# Patient Record
Sex: Female | Born: 1935 | Race: Black or African American | Hispanic: No | Smoking: Never smoker
Health system: Southern US, Community
[De-identification: ages and names within clinical notes are randomized; demographics above are authoritative.]

## PROBLEM LIST (undated history)

## (undated) DIAGNOSIS — N183 Chronic kidney disease, stage 3 unspecified: Secondary | ICD-10-CM

## (undated) DIAGNOSIS — M1990 Unspecified osteoarthritis, unspecified site: Secondary | ICD-10-CM

## (undated) DIAGNOSIS — I10 Essential (primary) hypertension: Secondary | ICD-10-CM

## (undated) DIAGNOSIS — E785 Hyperlipidemia, unspecified: Secondary | ICD-10-CM

## (undated) DIAGNOSIS — D649 Anemia, unspecified: Secondary | ICD-10-CM

## (undated) HISTORY — DX: Unspecified osteoarthritis, unspecified site: M19.90

## (undated) HISTORY — DX: Essential (primary) hypertension: I10

## (undated) HISTORY — DX: Chronic kidney disease, stage 3 (moderate): N18.3

## (undated) HISTORY — DX: Hyperlipidemia, unspecified: E78.5

## (undated) HISTORY — DX: Chronic kidney disease, stage 3 unspecified: N18.30

## (undated) HISTORY — DX: Anemia, unspecified: D64.9

---

## 2001-12-09 ENCOUNTER — Emergency Department (HOSPITAL_COMMUNITY): Admission: EM | Admit: 2001-12-09 | Discharge: 2001-12-09 | Payer: Self-pay | Admitting: *Deleted

## 2002-03-28 ENCOUNTER — Ambulatory Visit (HOSPITAL_COMMUNITY): Admission: RE | Admit: 2002-03-28 | Discharge: 2002-03-28 | Payer: Self-pay | Admitting: Family Medicine

## 2002-03-28 ENCOUNTER — Encounter: Payer: Self-pay | Admitting: Family Medicine

## 2003-11-09 ENCOUNTER — Ambulatory Visit (HOSPITAL_COMMUNITY): Admission: RE | Admit: 2003-11-09 | Discharge: 2003-11-09 | Payer: Self-pay | Admitting: Family Medicine

## 2005-03-24 ENCOUNTER — Ambulatory Visit (HOSPITAL_COMMUNITY): Admission: RE | Admit: 2005-03-24 | Discharge: 2005-03-24 | Payer: Self-pay | Admitting: Family Medicine

## 2005-04-23 ENCOUNTER — Ambulatory Visit (HOSPITAL_COMMUNITY): Admission: RE | Admit: 2005-04-23 | Discharge: 2005-04-23 | Payer: Self-pay | Admitting: Family Medicine

## 2005-07-11 ENCOUNTER — Ambulatory Visit (HOSPITAL_COMMUNITY): Admission: RE | Admit: 2005-07-11 | Discharge: 2005-07-11 | Payer: Self-pay | Admitting: Family Medicine

## 2005-09-25 ENCOUNTER — Encounter (INDEPENDENT_AMBULATORY_CARE_PROVIDER_SITE_OTHER): Payer: Self-pay | Admitting: Family Medicine

## 2005-09-25 LAB — CONVERTED CEMR LAB: Hgb A1c MFr Bld: 6.9 %

## 2006-04-28 ENCOUNTER — Ambulatory Visit: Payer: Self-pay | Admitting: Family Medicine

## 2006-05-07 ENCOUNTER — Encounter (INDEPENDENT_AMBULATORY_CARE_PROVIDER_SITE_OTHER): Payer: Self-pay | Admitting: Family Medicine

## 2006-05-22 ENCOUNTER — Ambulatory Visit: Payer: Self-pay | Admitting: Family Medicine

## 2006-05-30 ENCOUNTER — Encounter (INDEPENDENT_AMBULATORY_CARE_PROVIDER_SITE_OTHER): Payer: Self-pay | Admitting: Family Medicine

## 2006-06-11 ENCOUNTER — Encounter (INDEPENDENT_AMBULATORY_CARE_PROVIDER_SITE_OTHER): Payer: Self-pay | Admitting: Family Medicine

## 2006-06-15 ENCOUNTER — Encounter (INDEPENDENT_AMBULATORY_CARE_PROVIDER_SITE_OTHER): Payer: Self-pay | Admitting: Family Medicine

## 2006-06-19 ENCOUNTER — Ambulatory Visit: Payer: Self-pay | Admitting: Family Medicine

## 2006-06-20 ENCOUNTER — Encounter (INDEPENDENT_AMBULATORY_CARE_PROVIDER_SITE_OTHER): Payer: Self-pay | Admitting: Family Medicine

## 2006-06-22 ENCOUNTER — Encounter (INDEPENDENT_AMBULATORY_CARE_PROVIDER_SITE_OTHER): Payer: Self-pay | Admitting: Family Medicine

## 2006-06-26 ENCOUNTER — Ambulatory Visit: Payer: Self-pay | Admitting: Family Medicine

## 2006-06-28 ENCOUNTER — Encounter (INDEPENDENT_AMBULATORY_CARE_PROVIDER_SITE_OTHER): Payer: Self-pay | Admitting: Family Medicine

## 2006-07-11 ENCOUNTER — Encounter (INDEPENDENT_AMBULATORY_CARE_PROVIDER_SITE_OTHER): Payer: Self-pay | Admitting: Family Medicine

## 2006-07-24 ENCOUNTER — Ambulatory Visit: Payer: Self-pay | Admitting: Family Medicine

## 2006-08-20 ENCOUNTER — Encounter: Payer: Self-pay | Admitting: Family Medicine

## 2006-08-20 DIAGNOSIS — I10 Essential (primary) hypertension: Secondary | ICD-10-CM | POA: Insufficient documentation

## 2006-08-20 DIAGNOSIS — N183 Chronic kidney disease, stage 3 (moderate): Secondary | ICD-10-CM

## 2006-08-20 DIAGNOSIS — E785 Hyperlipidemia, unspecified: Secondary | ICD-10-CM

## 2006-08-20 DIAGNOSIS — M1990 Unspecified osteoarthritis, unspecified site: Secondary | ICD-10-CM | POA: Insufficient documentation

## 2006-08-20 DIAGNOSIS — L821 Other seborrheic keratosis: Secondary | ICD-10-CM

## 2006-09-17 ENCOUNTER — Encounter (INDEPENDENT_AMBULATORY_CARE_PROVIDER_SITE_OTHER): Payer: Self-pay | Admitting: Family Medicine

## 2006-09-18 ENCOUNTER — Encounter (INDEPENDENT_AMBULATORY_CARE_PROVIDER_SITE_OTHER): Payer: Self-pay | Admitting: Family Medicine

## 2006-10-28 ENCOUNTER — Encounter (INDEPENDENT_AMBULATORY_CARE_PROVIDER_SITE_OTHER): Payer: Self-pay | Admitting: Family Medicine

## 2006-10-29 ENCOUNTER — Encounter (INDEPENDENT_AMBULATORY_CARE_PROVIDER_SITE_OTHER): Payer: Self-pay | Admitting: Family Medicine

## 2006-10-30 ENCOUNTER — Ambulatory Visit: Payer: Self-pay | Admitting: Family Medicine

## 2006-10-30 DIAGNOSIS — E119 Type 2 diabetes mellitus without complications: Secondary | ICD-10-CM

## 2006-10-30 LAB — CONVERTED CEMR LAB
Cholesterol, target level: 200 mg/dL
Glucose, Bld: 204 mg/dL
HDL goal, serum: 40 mg/dL
Hgb A1c MFr Bld: 6.2 %
LDL Goal: 100 mg/dL

## 2006-12-14 ENCOUNTER — Encounter (INDEPENDENT_AMBULATORY_CARE_PROVIDER_SITE_OTHER): Payer: Self-pay | Admitting: Family Medicine

## 2007-01-08 ENCOUNTER — Encounter (INDEPENDENT_AMBULATORY_CARE_PROVIDER_SITE_OTHER): Payer: Self-pay | Admitting: Family Medicine

## 2007-01-11 LAB — CONVERTED CEMR LAB
ALT: <8 units/L (ref 0–35)
AST: 11 units/L (ref 0–37)
Albumin: 3.4 g/dL — ABNORMAL LOW (ref 3.5–5.2)
Alkaline Phosphatase: 40 units/L (ref 39–117)
BUN: 31 mg/dL — ABNORMAL HIGH (ref 6–23)
Basophils Absolute: 0 10*3/uL (ref 0.0–0.1)
Basophils Relative: 1 % (ref 0–1)
Calcium: 9 mg/dL (ref 8.4–10.5)
Creatinine, Ser: 1.59 mg/dL — ABNORMAL HIGH (ref 0.40–1.20)
Eosinophils Absolute: 0.4 10*3/uL (ref 0.0–0.7)
HCT: 31.2 % — ABNORMAL LOW (ref 36.0–46.0)
HDL: 35 mg/dL — ABNORMAL LOW (ref >39)
Hemoglobin: 9.5 g/dL — ABNORMAL LOW (ref 12.0–15.0)
LDL Cholesterol: 96 mg/dL (ref 0–99)
Lymphocytes Relative: 37 % (ref 12–46)
MCHC: 30.4 g/dL (ref 30.0–36.0)
MCV: 87.9 fL (ref 78.0–100.0)
Monocytes Absolute: 0.5 10*3/uL (ref 0.2–0.7)
Monocytes Relative: 9 % (ref 3–11)
Neutro Abs: 2.4 10*3/uL (ref 1.7–7.7)
Neutrophils Relative %: 46 % (ref 43–77)
Platelets: 270 10*3/uL (ref 150–400)
Potassium: 4.9 meq/L (ref 3.5–5.3)
RDW: 15.4 % — ABNORMAL HIGH (ref 11.5–14.0)
Sodium: 139 meq/L (ref 135–145)
TSH: 2.832 microintl units/mL (ref 0.350–5.50)
Total CHOL/HDL Ratio: 4.5
Total Protein: 6.6 g/dL (ref 6.0–8.3)
Triglycerides: 135 mg/dL (ref <150)
VLDL: 27 mg/dL (ref 0–40)
WBC: 5.2 10*3/uL (ref 4.0–10.5)

## 2007-01-14 ENCOUNTER — Encounter (INDEPENDENT_AMBULATORY_CARE_PROVIDER_SITE_OTHER): Payer: Self-pay | Admitting: Family Medicine

## 2007-01-15 ENCOUNTER — Encounter (INDEPENDENT_AMBULATORY_CARE_PROVIDER_SITE_OTHER): Payer: Self-pay | Admitting: Family Medicine

## 2007-01-22 ENCOUNTER — Ambulatory Visit: Payer: Self-pay | Admitting: Family Medicine

## 2007-01-22 DIAGNOSIS — D649 Anemia, unspecified: Secondary | ICD-10-CM

## 2007-01-22 LAB — CONVERTED CEMR LAB
Glucose, Bld: 200 mg/dL
Hemoglobin: 10.1 g/dL
Hgb A1c MFr Bld: 6.1 %

## 2007-01-25 LAB — CONVERTED CEMR LAB
Ferritin: 73 ng/mL (ref 10–291)
Folate: >20 ng/mL
Iron: 71 ug/dL (ref 42–145)
Retic Count, Absolute: 45.5 (ref 19.0–186.0)
Retic Ct Pct: 1.3 % (ref 0.4–3.1)
Saturation Ratios: 25 % (ref 20–55)
TIBC: 285 ug/dL (ref 250–470)
UIBC: 214 ug/dL
Vitamin B-12: 242 pg/mL (ref 211–911)

## 2007-02-03 ENCOUNTER — Ambulatory Visit: Payer: Self-pay | Admitting: Family Medicine

## 2007-02-16 ENCOUNTER — Encounter (INDEPENDENT_AMBULATORY_CARE_PROVIDER_SITE_OTHER): Payer: Self-pay | Admitting: Family Medicine

## 2007-03-01 ENCOUNTER — Encounter (INDEPENDENT_AMBULATORY_CARE_PROVIDER_SITE_OTHER): Payer: Self-pay | Admitting: Family Medicine

## 2007-03-15 ENCOUNTER — Encounter (INDEPENDENT_AMBULATORY_CARE_PROVIDER_SITE_OTHER): Payer: Self-pay | Admitting: Family Medicine

## 2007-03-16 ENCOUNTER — Encounter (INDEPENDENT_AMBULATORY_CARE_PROVIDER_SITE_OTHER): Payer: Self-pay | Admitting: Family Medicine

## 2007-03-19 ENCOUNTER — Ambulatory Visit: Payer: Self-pay | Admitting: Family Medicine

## 2007-03-19 LAB — CONVERTED CEMR LAB: Hemoglobin: 10 g/dL

## 2007-05-20 ENCOUNTER — Encounter (INDEPENDENT_AMBULATORY_CARE_PROVIDER_SITE_OTHER): Payer: Self-pay | Admitting: Family Medicine

## 2007-05-31 ENCOUNTER — Ambulatory Visit: Payer: Self-pay | Admitting: Family Medicine

## 2007-05-31 LAB — CONVERTED CEMR LAB
Glucose, Bld: 209 mg/dL
Hgb A1c MFr Bld: 6.2 %

## 2007-06-01 ENCOUNTER — Telehealth (INDEPENDENT_AMBULATORY_CARE_PROVIDER_SITE_OTHER): Payer: Self-pay | Admitting: *Deleted

## 2007-06-01 ENCOUNTER — Encounter (INDEPENDENT_AMBULATORY_CARE_PROVIDER_SITE_OTHER): Payer: Self-pay | Admitting: Family Medicine

## 2007-06-01 LAB — CONVERTED CEMR LAB
ALT: <8 units/L (ref 0–35)
AST: 14 units/L (ref 0–37)
Albumin: 3.6 g/dL (ref 3.5–5.2)
Alkaline Phosphatase: 41 units/L (ref 39–117)
BUN: 36 mg/dL — ABNORMAL HIGH (ref 6–23)
Basophils Absolute: 0 10*3/uL (ref 0.0–0.1)
Basophils Relative: 0 % (ref 0–1)
CO2: 19 meq/L (ref 19–32)
Calcium: 9 mg/dL (ref 8.4–10.5)
Chloride: 106 meq/L (ref 96–112)
Creatinine, Ser: 1.72 mg/dL — ABNORMAL HIGH (ref 0.40–1.20)
Eosinophils Absolute: 0.3 10*3/uL (ref 0.0–0.7)
Eosinophils Relative: 6 % — ABNORMAL HIGH (ref 0–5)
Glucose, Bld: 152 mg/dL — ABNORMAL HIGH (ref 70–99)
HCT: 30.7 % — ABNORMAL LOW (ref 36.0–46.0)
Hemoglobin: 9.5 g/dL — ABNORMAL LOW (ref 12.0–15.0)
Lymphocytes Relative: 34 % (ref 12–46)
Lymphs Abs: 1.6 10*3/uL (ref 0.7–3.3)
MCHC: 30.9 g/dL (ref 30.0–36.0)
MCV: 86 fL (ref 78.0–100.0)
Monocytes Absolute: 0.4 10*3/uL (ref 0.2–0.7)
Monocytes Relative: 9 % (ref 3–11)
Neutro Abs: 2.3 10*3/uL (ref 1.7–7.7)
Neutrophils Relative %: 51 % (ref 43–77)
Platelets: 240 10*3/uL (ref 150–400)
Potassium: 4.4 meq/L (ref 3.5–5.3)
RBC: 3.57 M/uL — ABNORMAL LOW (ref 3.87–5.11)
RDW: 14.7 % — ABNORMAL HIGH (ref 11.5–14.0)
Sodium: 139 meq/L (ref 135–145)
Total Bilirubin: 0.4 mg/dL (ref 0.3–1.2)
Total Protein: 7.1 g/dL (ref 6.0–8.3)
WBC: 4.6 10*3/uL (ref 4.0–10.5)

## 2007-06-28 ENCOUNTER — Encounter (INDEPENDENT_AMBULATORY_CARE_PROVIDER_SITE_OTHER): Payer: Self-pay | Admitting: Family Medicine

## 2007-06-29 ENCOUNTER — Encounter (INDEPENDENT_AMBULATORY_CARE_PROVIDER_SITE_OTHER): Payer: Self-pay | Admitting: Family Medicine

## 2007-07-13 ENCOUNTER — Encounter (INDEPENDENT_AMBULATORY_CARE_PROVIDER_SITE_OTHER): Payer: Self-pay | Admitting: Family Medicine

## 2007-08-31 ENCOUNTER — Ambulatory Visit: Payer: Self-pay | Admitting: Family Medicine

## 2007-08-31 LAB — CONVERTED CEMR LAB
Glucose, Bld: 117 mg/dL
Hgb A1c MFr Bld: 5.9 %

## 2007-09-01 ENCOUNTER — Encounter (INDEPENDENT_AMBULATORY_CARE_PROVIDER_SITE_OTHER): Payer: Self-pay | Admitting: Family Medicine

## 2007-09-02 ENCOUNTER — Telehealth (INDEPENDENT_AMBULATORY_CARE_PROVIDER_SITE_OTHER): Payer: Self-pay | Admitting: *Deleted

## 2007-09-02 LAB — CONVERTED CEMR LAB
ALT: <8 units/L (ref 0–35)
AST: 12 units/L (ref 0–37)
Albumin: 3.7 g/dL (ref 3.5–5.2)
Alkaline Phosphatase: 44 units/L (ref 39–117)
BUN: 31 mg/dL — ABNORMAL HIGH (ref 6–23)
Basophils Absolute: 0 10*3/uL (ref 0.0–0.1)
Basophils Relative: 1 % (ref 0–1)
CO2: 21 meq/L (ref 19–32)
Calcium: 8.9 mg/dL (ref 8.4–10.5)
Chloride: 109 meq/L (ref 96–112)
Creatinine, Ser: 1.59 mg/dL — ABNORMAL HIGH (ref 0.40–1.20)
Eosinophils Absolute: 0.4 10*3/uL (ref 0.2–0.7)
Eosinophils Relative: 8 % — ABNORMAL HIGH (ref 0–5)
Glucose, Bld: 83 mg/dL (ref 70–99)
HCT: 32.4 % — ABNORMAL LOW (ref 36.0–46.0)
Hemoglobin: 10 g/dL — ABNORMAL LOW (ref 12.0–15.0)
Lymphocytes Relative: 37 % (ref 12–46)
Lymphs Abs: 1.8 10*3/uL (ref 0.7–4.0)
MCHC: 30.9 g/dL (ref 30.0–36.0)
MCV: 88.3 fL (ref 78.0–100.0)
Monocytes Absolute: 0.4 10*3/uL (ref 0.1–1.0)
Monocytes Relative: 9 % (ref 3–12)
Neutro Abs: 2.3 10*3/uL (ref 1.7–7.7)
Neutrophils Relative %: 46 % (ref 43–77)
Platelets: 223 10*3/uL (ref 150–400)
Potassium: 4.4 meq/L (ref 3.5–5.3)
RBC: 3.67 M/uL — ABNORMAL LOW (ref 3.87–5.11)
RDW: 15.5 % (ref 11.5–15.5)
Sodium: 141 meq/L (ref 135–145)
Total Bilirubin: 0.3 mg/dL (ref 0.3–1.2)
Total Protein: 7 g/dL (ref 6.0–8.3)
WBC: 4.9 10*3/uL (ref 4.0–10.5)

## 2007-09-10 ENCOUNTER — Encounter (INDEPENDENT_AMBULATORY_CARE_PROVIDER_SITE_OTHER): Payer: Self-pay | Admitting: Family Medicine

## 2007-10-28 ENCOUNTER — Encounter (INDEPENDENT_AMBULATORY_CARE_PROVIDER_SITE_OTHER): Payer: Self-pay | Admitting: Family Medicine

## 2007-11-12 ENCOUNTER — Encounter (INDEPENDENT_AMBULATORY_CARE_PROVIDER_SITE_OTHER): Payer: Self-pay | Admitting: Family Medicine

## 2007-11-30 ENCOUNTER — Ambulatory Visit: Payer: Self-pay | Admitting: Family Medicine

## 2007-11-30 ENCOUNTER — Telehealth (INDEPENDENT_AMBULATORY_CARE_PROVIDER_SITE_OTHER): Payer: Self-pay | Admitting: *Deleted

## 2007-11-30 LAB — CONVERTED CEMR LAB
Glucose, Bld: 235 mg/dL
Hemoglobin: 10.3 g/dL
Hgb A1c MFr Bld: 6 %

## 2007-12-16 ENCOUNTER — Encounter (INDEPENDENT_AMBULATORY_CARE_PROVIDER_SITE_OTHER): Payer: Self-pay | Admitting: Family Medicine

## 2008-01-10 ENCOUNTER — Encounter (INDEPENDENT_AMBULATORY_CARE_PROVIDER_SITE_OTHER): Payer: Self-pay | Admitting: Family Medicine

## 2008-01-11 ENCOUNTER — Ambulatory Visit: Payer: Self-pay | Admitting: Family Medicine

## 2008-01-11 LAB — CONVERTED CEMR LAB
Bilirubin Urine: NEGATIVE
Blood in Urine, dipstick: NEGATIVE
Glucose, Urine, Semiquant: NEGATIVE
Ketones, urine, test strip: NEGATIVE
Nitrite: NEGATIVE
Protein, U semiquant: NEGATIVE
Specific Gravity, Urine: 1.01
Urobilinogen, UA: 0.2
pH: 7

## 2008-01-13 ENCOUNTER — Telehealth (INDEPENDENT_AMBULATORY_CARE_PROVIDER_SITE_OTHER): Payer: Self-pay | Admitting: *Deleted

## 2008-01-13 LAB — CONVERTED CEMR LAB
ALT: <8 units/L (ref 0–35)
AST: 13 units/L (ref 0–37)
Albumin: 3.8 g/dL (ref 3.5–5.2)
Alkaline Phosphatase: 44 units/L (ref 39–117)
BUN: 32 mg/dL — ABNORMAL HIGH (ref 6–23)
CO2: 24 meq/L (ref 19–32)
Calcium: 9.1 mg/dL (ref 8.4–10.5)
Chloride: 105 meq/L (ref 96–112)
Cholesterol: 173 mg/dL (ref 0–200)
Creatinine, Ser: 1.64 mg/dL — ABNORMAL HIGH (ref 0.40–1.20)
Creatinine, Urine: 153.3 mg/dL
Glucose, Bld: 99 mg/dL (ref 70–99)
HDL: 32 mg/dL — ABNORMAL LOW (ref >39)
LDL Cholesterol: 97 mg/dL (ref 0–99)
Microalb Creat Ratio: 9 mg/g (ref 0.0–30.0)
Microalb, Ur: 1.38 mg/dL (ref 0.00–1.89)
Potassium: 4.6 meq/L (ref 3.5–5.3)
Sodium: 140 meq/L (ref 135–145)
TSH: 4.841 microintl units/mL (ref 0.350–5.50)
Total Bilirubin: 0.3 mg/dL (ref 0.3–1.2)
Total CHOL/HDL Ratio: 5.4
Total Protein: 7.2 g/dL (ref 6.0–8.3)
Triglycerides: 219 mg/dL — ABNORMAL HIGH (ref <150)
VLDL: 44 mg/dL — ABNORMAL HIGH (ref 0–40)

## 2008-02-10 ENCOUNTER — Ambulatory Visit: Payer: Self-pay | Admitting: Family Medicine

## 2008-05-11 ENCOUNTER — Ambulatory Visit: Payer: Self-pay | Admitting: Family Medicine

## 2008-05-11 LAB — CONVERTED CEMR LAB
Glucose, Bld: 114 mg/dL
Hgb A1c MFr Bld: 6 %

## 2008-05-12 ENCOUNTER — Encounter (INDEPENDENT_AMBULATORY_CARE_PROVIDER_SITE_OTHER): Payer: Self-pay | Admitting: Family Medicine

## 2008-05-12 LAB — CONVERTED CEMR LAB
BUN: 31 mg/dL — ABNORMAL HIGH (ref 6–23)
Basophils Absolute: 0 10*3/uL (ref 0.0–0.1)
Basophils Relative: 1 % (ref 0–1)
CO2: 22 meq/L (ref 19–32)
Calcium: 8.7 mg/dL (ref 8.4–10.5)
Chloride: 108 meq/L (ref 96–112)
Creatinine, Ser: 1.64 mg/dL — ABNORMAL HIGH (ref 0.40–1.20)
Eosinophils Absolute: 0.3 10*3/uL (ref 0.0–0.7)
Eosinophils Relative: 8 % — ABNORMAL HIGH (ref 0–5)
Glucose, Bld: 106 mg/dL — ABNORMAL HIGH (ref 70–99)
HCT: 29.4 % — ABNORMAL LOW (ref 36.0–46.0)
Hemoglobin: 9.4 g/dL — ABNORMAL LOW (ref 12.0–15.0)
Lymphocytes Relative: 45 % (ref 12–46)
Lymphs Abs: 1.9 10*3/uL (ref 0.7–4.0)
MCHC: 32 g/dL (ref 30.0–36.0)
MCV: 86 fL (ref 78.0–100.0)
Monocytes Absolute: 0.4 10*3/uL (ref 0.1–1.0)
Monocytes Relative: 10 % (ref 3–12)
Neutro Abs: 1.5 10*3/uL — ABNORMAL LOW (ref 1.7–7.7)
Neutrophils Relative %: 37 % — ABNORMAL LOW (ref 43–77)
Platelets: 216 10*3/uL (ref 150–400)
Potassium: 4.4 meq/L (ref 3.5–5.3)
RBC: 3.42 M/uL — ABNORMAL LOW (ref 3.87–5.11)
RDW: 15 % (ref 11.5–15.5)
Sodium: 140 meq/L (ref 135–145)
WBC: 4.2 10*3/uL (ref 4.0–10.5)

## 2008-08-10 ENCOUNTER — Ambulatory Visit: Payer: Self-pay | Admitting: Family Medicine

## 2008-08-10 LAB — CONVERTED CEMR LAB
Blood Glucose, Fasting: 109 mg/dL
Hgb A1c MFr Bld: 5.9 %

## 2008-08-11 ENCOUNTER — Encounter (INDEPENDENT_AMBULATORY_CARE_PROVIDER_SITE_OTHER): Payer: Self-pay | Admitting: Family Medicine

## 2008-08-11 LAB — CONVERTED CEMR LAB
ALT: <8 units/L (ref 0–35)
AST: 10 units/L (ref 0–37)
Albumin: 3.4 g/dL — ABNORMAL LOW (ref 3.5–5.2)
Alkaline Phosphatase: 51 units/L (ref 39–117)
BUN: 26 mg/dL — ABNORMAL HIGH (ref 6–23)
CO2: 20 meq/L (ref 19–32)
Calcium: 8.7 mg/dL (ref 8.4–10.5)
Chloride: 107 meq/L (ref 96–112)
Cholesterol: 159 mg/dL (ref 0–200)
Creatinine, Ser: 1.47 mg/dL — ABNORMAL HIGH (ref 0.40–1.20)
Glucose, Bld: 89 mg/dL (ref 70–99)
HDL: 33 mg/dL — ABNORMAL LOW (ref >39)
LDL Cholesterol: 98 mg/dL (ref 0–99)
Potassium: 4.4 meq/L (ref 3.5–5.3)
Sodium: 140 meq/L (ref 135–145)
Total Bilirubin: 0.3 mg/dL (ref 0.3–1.2)
Total CHOL/HDL Ratio: 4.8
Total Protein: 6.7 g/dL (ref 6.0–8.3)
Triglycerides: 138 mg/dL (ref <150)
VLDL: 28 mg/dL (ref 0–40)

## 2008-09-19 ENCOUNTER — Encounter (INDEPENDENT_AMBULATORY_CARE_PROVIDER_SITE_OTHER): Payer: Self-pay | Admitting: Family Medicine

## 2008-10-23 ENCOUNTER — Telehealth (INDEPENDENT_AMBULATORY_CARE_PROVIDER_SITE_OTHER): Payer: Self-pay | Admitting: Family Medicine

## 2008-11-13 ENCOUNTER — Ambulatory Visit: Payer: Self-pay | Admitting: Family Medicine

## 2008-11-13 LAB — CONVERTED CEMR LAB
Glucose, Bld: 146 mg/dL
Hgb A1c MFr Bld: 5.9 %

## 2008-11-14 ENCOUNTER — Encounter (INDEPENDENT_AMBULATORY_CARE_PROVIDER_SITE_OTHER): Payer: Self-pay | Admitting: Family Medicine

## 2008-11-14 LAB — CONVERTED CEMR LAB
ALT: 8 units/L (ref 0–35)
AST: 16 units/L (ref 0–37)
Albumin: 4 g/dL (ref 3.5–5.2)
Alkaline Phosphatase: 53 units/L (ref 39–117)
BUN: 17 mg/dL (ref 6–23)
Basophils Absolute: 0 10*3/uL (ref 0.0–0.1)
Basophils Relative: 1 % (ref 0–1)
CO2: 19 meq/L (ref 19–32)
Calcium: 8.7 mg/dL (ref 8.4–10.5)
Chloride: 107 meq/L (ref 96–112)
Creatinine, Ser: 1.32 mg/dL — ABNORMAL HIGH (ref 0.40–1.20)
Eosinophils Absolute: 0.2 10*3/uL (ref 0.0–0.7)
Eosinophils Relative: 4 % (ref 0–5)
Ferritin: 125 ng/mL (ref 10–291)
Folate: 15.5 ng/mL
Glucose, Bld: 159 mg/dL — ABNORMAL HIGH (ref 70–99)
HCT: 31.9 % — ABNORMAL LOW (ref 36.0–46.0)
Hemoglobin: 9.6 g/dL — ABNORMAL LOW (ref 12.0–15.0)
Iron: 61 ug/dL (ref 42–145)
Lymphocytes Relative: 32 % (ref 12–46)
Lymphs Abs: 1.5 10*3/uL (ref 0.7–4.0)
MCHC: 30.1 g/dL (ref 30.0–36.0)
MCV: 88.4 fL (ref 78.0–100.0)
Monocytes Absolute: 0.3 10*3/uL (ref 0.1–1.0)
Monocytes Relative: 7 % (ref 3–12)
Neutro Abs: 2.5 10*3/uL (ref 1.7–7.7)
Neutrophils Relative %: 56 % (ref 43–77)
Platelets: 293 10*3/uL (ref 150–400)
Potassium: 4.4 meq/L (ref 3.5–5.3)
RBC: 3.61 M/uL — ABNORMAL LOW (ref 3.87–5.11)
RDW: 15.4 % (ref 11.5–15.5)
Retic Ct Pct: 1.2 % (ref 0.4–3.1)
Saturation Ratios: 23 % (ref 20–55)
Sodium: 140 meq/L (ref 135–145)
TIBC: 269 ug/dL (ref 250–470)
Total Bilirubin: 0.4 mg/dL (ref 0.3–1.2)
Total Protein: 7 g/dL (ref 6.0–8.3)
UIBC: 208 ug/dL
Vitamin B-12: 281 pg/mL (ref 211–911)
WBC: 4.5 10*3/uL (ref 4.0–10.5)

## 2008-11-16 ENCOUNTER — Telehealth (INDEPENDENT_AMBULATORY_CARE_PROVIDER_SITE_OTHER): Payer: Self-pay | Admitting: Family Medicine

## 2008-11-24 ENCOUNTER — Telehealth (INDEPENDENT_AMBULATORY_CARE_PROVIDER_SITE_OTHER): Payer: Self-pay | Admitting: *Deleted

## 2008-11-28 ENCOUNTER — Ambulatory Visit: Payer: Self-pay | Admitting: Family Medicine

## 2008-11-28 ENCOUNTER — Telehealth (INDEPENDENT_AMBULATORY_CARE_PROVIDER_SITE_OTHER): Payer: Self-pay | Admitting: Family Medicine

## 2008-11-29 ENCOUNTER — Ambulatory Visit: Payer: Self-pay | Admitting: Family Medicine

## 2008-11-29 ENCOUNTER — Encounter (INDEPENDENT_AMBULATORY_CARE_PROVIDER_SITE_OTHER): Payer: Self-pay | Admitting: *Deleted

## 2008-11-29 DIAGNOSIS — I209 Angina pectoris, unspecified: Secondary | ICD-10-CM

## 2008-11-30 ENCOUNTER — Telehealth (INDEPENDENT_AMBULATORY_CARE_PROVIDER_SITE_OTHER): Payer: Self-pay | Admitting: *Deleted

## 2008-11-30 ENCOUNTER — Telehealth (INDEPENDENT_AMBULATORY_CARE_PROVIDER_SITE_OTHER): Payer: Self-pay | Admitting: Family Medicine

## 2008-11-30 ENCOUNTER — Encounter (INDEPENDENT_AMBULATORY_CARE_PROVIDER_SITE_OTHER): Payer: Self-pay | Admitting: Family Medicine

## 2008-12-11 ENCOUNTER — Ambulatory Visit: Payer: Self-pay | Admitting: Cardiology

## 2008-12-11 ENCOUNTER — Encounter (INDEPENDENT_AMBULATORY_CARE_PROVIDER_SITE_OTHER): Payer: Self-pay | Admitting: Family Medicine

## 2008-12-14 ENCOUNTER — Ambulatory Visit: Payer: Self-pay | Admitting: Internal Medicine

## 2008-12-15 ENCOUNTER — Encounter: Payer: Self-pay | Admitting: Internal Medicine

## 2008-12-26 ENCOUNTER — Ambulatory Visit: Payer: Self-pay | Admitting: Family Medicine

## 2008-12-26 ENCOUNTER — Ambulatory Visit: Payer: Self-pay | Admitting: Internal Medicine

## 2009-01-01 ENCOUNTER — Ambulatory Visit (HOSPITAL_COMMUNITY): Admission: RE | Admit: 2009-01-01 | Discharge: 2009-01-01 | Payer: Self-pay | Admitting: Internal Medicine

## 2009-01-01 ENCOUNTER — Ambulatory Visit: Payer: Self-pay | Admitting: Internal Medicine

## 2009-01-03 ENCOUNTER — Telehealth (INDEPENDENT_AMBULATORY_CARE_PROVIDER_SITE_OTHER): Payer: Self-pay | Admitting: *Deleted

## 2009-01-04 ENCOUNTER — Ambulatory Visit: Payer: Self-pay | Admitting: Family Medicine

## 2009-01-11 ENCOUNTER — Ambulatory Visit (HOSPITAL_COMMUNITY): Admission: RE | Admit: 2009-01-11 | Discharge: 2009-01-11 | Payer: Self-pay | Admitting: Family Medicine

## 2009-02-07 ENCOUNTER — Ambulatory Visit: Payer: Self-pay | Admitting: Family Medicine

## 2009-02-07 LAB — CONVERTED CEMR LAB
Glucose, Bld: 127 mg/dL
Hgb A1c MFr Bld: 6.3 %
LDL Goal: 70 mg/dL

## 2009-04-30 ENCOUNTER — Telehealth (INDEPENDENT_AMBULATORY_CARE_PROVIDER_SITE_OTHER): Payer: Self-pay | Admitting: *Deleted

## 2009-05-09 ENCOUNTER — Ambulatory Visit: Payer: Self-pay | Admitting: Family Medicine

## 2009-05-09 LAB — CONVERTED CEMR LAB
Glucose, Bld: 122 mg/dL
Hgb A1c MFr Bld: 6.1 %

## 2009-05-13 ENCOUNTER — Encounter (INDEPENDENT_AMBULATORY_CARE_PROVIDER_SITE_OTHER): Payer: Self-pay | Admitting: Family Medicine

## 2010-02-05 ENCOUNTER — Encounter (INDEPENDENT_AMBULATORY_CARE_PROVIDER_SITE_OTHER): Payer: Self-pay | Admitting: *Deleted

## 2010-06-26 ENCOUNTER — Encounter (INDEPENDENT_AMBULATORY_CARE_PROVIDER_SITE_OTHER): Payer: Self-pay | Admitting: *Deleted

## 2010-10-13 ENCOUNTER — Encounter: Payer: Self-pay | Admitting: Family Medicine

## 2010-10-20 LAB — CONVERTED CEMR LAB
Hgb A1c MFr Bld: 6.5 %
Hgb A1c MFr Bld: 6.5 %
RBC count: 4.26 10*6/uL
WBC, blood: 5.7 10*3/uL

## 2010-10-22 NOTE — Letter (Signed)
Summary: Appointment - Reminder 2  [facility] [place]. [address], [place] [address]   Phone: [phone]  Fax: [phone]     June 26, 2010 MRN: [id number]   AMELA BISSEN [place], [place]  [address]   Dear Ms. Maricela Bo,  [facility] records indicate that it is time to schedule a follow-up appointment.  Dr.  Verl Blalock        recommended that you follow up with [place] in     11/2009       . It is very important that we reach you to schedule this appointment. We look forward to participating in your health care needs. Please contact us at the number listed above at your earliest convenience to schedule your appointment.  If you are unable to make an appointment at this time, give [place] a call so we can update our records.     Sincerely,   Public relations account executive

## 2010-10-22 NOTE — Letter (Signed)
Summary: Appointment - Reminder 2  [facility] [place]. [address], [place] [address]   Phone: [phone]  Fax: [phone]     Feb 05, 2010 MRN: [id number]   Lynn Mcclure [place],   [address]   Dear Lynn Mcclure,  [facility] records indicate that it is time to schedule a follow-up appointment.  Dr.  Verl Blalock        recommended that you follow up with [place] in      11/2009      . It is very important that we reach you to schedule this appointment. We look forward to participating in your health care needs. Please contact us at the number listed above at your earliest convenience to schedule your appointment.  If you are unable to make an appointment at this time, give [place] a call so we can update our records.     Sincerely,   Public relations account executive

## 2011-01-01 LAB — RETICULIN ANTIBODIES, IGA W TITER: Reticulin Ab, IgA: NEGATIVE

## 2011-01-01 LAB — GLIADIN ANTIBODIES, SERUM: Gliadin IgA: 2.4 U/mL (ref <7)

## 2011-01-01 LAB — TISSUE TRANSGLUTAMINASE, IGA: Tissue Transglutaminase Ab, IgA: 0.1 U/mL (ref <7)

## 2011-01-01 LAB — IGA: IgA: 200 mg/dL (ref 68–378)

## 2011-02-04 NOTE — Op Note (Signed)
Lynn Mcclure, Lynn Mcclure           ACCOUNT NO.:  [id number]   MEDICAL RECORD NO.:  [id number]          PATIENT TYPE:  AMB   LOCATION:  DAY                           FACILITY:  [facility]   PHYSICIAN:  R. Garfield Cornea, M.D. DATE OF BIRTH:  August 20, 1934   DATE OF PROCEDURE:  01/01/2009  DATE OF DISCHARGE:                               OPERATIVE REPORT   PROCEDURE PERFORMED:  Ileocolonoscopy diagnostic.   INDICATIONS FOR PROCEDURE:  A 77 year old lady with anemia normocytic.  She has been on iron stools chronically dark.  She has not had any  melena or hematochezia, nor has she had any abdominal pain and no upper  GI tract symptoms such as odynophagia, dysphagia, early satiety or  reflux symptoms.  She had been now Hemoccult negative x4.  She has never  had a colonoscopy.  Colonoscopy is now being done.  Plans were to  perform EGD if colonoscopy unremarkable in a setting of GI bleeding.  However, there is no evidence of GI bleeding and consequently plans  being made for colonoscopy only today.  Risks, benefits, alternatives  and limitations discussed at length, previously and again today at  bedside, questions answered, all parties agreeable.   PROCEDURE NOTE:  O2 saturation, blood pressure, pulse and respirations  were monitored throughout the entire procedure.   CONSCIOUS SEDATION:  Versed 5 mg IV, Demerol 75 mg IV in divided doses.   INSTRUMENT USED:  Pentax video chip system.   FINDINGS:  Digital rectal exam revealed no abnormalities.   ENDOSCOPIC FINDINGS:  The prep was adequate.  Colon:  Colonic mucosa was surveyed from the rectosigmoid junction  through the left, transverse and right colon.  Area of appendiceal  orifice, ileocecal valve and cecum.  These structures were well seen and  photographed for the record.  Terminal ileum was intubated to 10 cm.  From this level, scope was slowly and cautiously withdrawn.  All  previously mentioned mucosal surfaces were again seen.  The  patient was  noted to have a normal-appearing colonic mucosa.  Terminal ileum  appeared normal.  Scope was pulled down to the rectum where thorough  examination of the rectal mucosa including retroflexion of the anal  verge demonstrated only some anal papilla.  The patient tolerated the  procedure well.  Cecal to withdrawal time 7 minutes.   IMPRESSION:  Anal papilla, otherwise normal rectum, colon and terminal  ileum.   RECOMMENDATIONS:  We will perform a serum IgA level and celiac antibody  panel to complete the GI evaluation.   If celiac antibody panel is negative, then I would make plans for [name] to visit the hematologist.      Bridgette Habermann, M.D.  Electronically Signed     RMR/MEDQ  D:  01/01/2009  T:  01/01/2009  Job:  [id number]   cc:   Weston Settle, M.D.

## 2011-02-04 NOTE — Assessment & Plan Note (Signed)
[facility] OFFICE NOTE   NAME:Sitzman, JAZIEL GASPARIAN                  MRN:          [id number]  DATE:12/11/2008                            DOB:          Feb 06, 1934    Ms. Erikson is referred by Dr. Weston Settle with [facility] here in [place].   CHIEF COMPLAINT:  Chest pain.   HISTORY OF PRESENT ILLNESS:  Ms. John Recendiz is a very hard  working, very independent, widowed black female, who comes today with  chest discomfort.   She describes chest aching with no radiation with activity.  It  generally last about 20-30 minutes.  It usually goes away with lying  down and resting.   There is no other associated symptoms.   She was told years ago by Dr. Berdine Addison that she had angina.  She says that  it has not gotten any worse and has not been more frequent.   She denies any nocturnal or rest pain.   She recently saw Dr. Jonna Munro who is her new primary care physician.  He  elicited a history of chest discomfort and referred her over,  particularly since she is over the age of 4 now, and has hypertension  and diabetes.  She has been diabetic for about 4 years.   Her past medical history is significant for colonoscopy.  She does not  smoke, does not drink.  She has had no previous hospitalizations.   SOCIAL HISTORY:  She is retired.  She worked in [employer] as well  as in [place].  She is widowed and has 2 children.  Her daughter  Hoyle Sauer is with her today, who is very helpful.   FAMILY HISTORY:  Remarkable for one brother dying of myocardial  infarction.   REVIEW OF SYSTEMS:  She wears reading glasses.  She has top and bottom  dentures.  Rest of her review of systems is completely negative.  Please  refer our diagnostic evaluation form.   She is intolerant of BEXTRA and LODINE as well as SULFA drugs.  She has  also had a problem with DYE in the past.   CURRENT MEDICATIONS:  1. Lisinopril 20 mg per day.  2. Isosorbide mononitrate 10 mg p.o. b.i.d.  3. SSS tonic t.i.d.  4. Glipizide 5 mg a day.  5. Potassium 10 mEq a day.  6. Lescol XL 80 mg per day.   PHYSICAL EXAMINATION:  GENERAL:  She is a very talkative, but also very  well informed and intelligent.  She alert and oriented x3.  SKIN:  Warm and dry.  VITAL SIGNS:  Her blood pressure 110/78, her pulse is 74 and regular.  She weighs 200 pounds.  HEENT:  Normocephalic and atraumatic.  She has several moles under her  eyes and skin tags.  PERRLA.  Extraocular movements  intact.  Sclerae  are clear.  Mouth is normal except for dentures.  NECK:  Supple.  Carotid upstrokes were equal bilaterally without bruits.  No JVD.  Thyroid is not enlarged.  Trachea is midline.  LUNGS:  Clear to  auscultation and percussion.  HEART:  Nondisplaced PMI.  She has soft S1 and S2.  No murmur or gallop.  ABDOMEN:  Soft and good bowel sounds.  No midline bruit.  No  hepatomegaly.  EXTREMITIES:  No cyanosis, clubbing, or edema.  Pulses were present.  NEUROLOGIC:  Intact.  SKIN:  Unremarkable.   Her electrocardiogram is essentially normal.  It is identical to the one  she had in her primary care physician's office.   ASSESSMENT:  Ms. Giorno clearly has what sounds like some stable  exertional angina.  She and her daughter confirmed the fact that it has  been there for several years.  It is not occurring at night or at rest.   She really does not like taking aspirin because of easy bruisability.  This was on 81 mg per day.  After a long discussion, we have  talked her  to take at least one 3 times a week.   She has never used or carried nitroglycerin.  I have prescribed this for  her today, and I have gone over at length on how to use that  including  how to activate 911 to angina or chest discomfort unresponsive to  nitroglycerin x2.   She had been on atenolol, HCTZ, but had a allergic reaction  to the HCTZ.  I have asked her back on her atenolol 25 mg per day.   I have made no other changes in medical program.   I have also told that her for symptoms become more frequent or she has  any rest pain to call and let us know that her angina is worse.    Thomas C. Verl Blalock, MD, [facility]  Electronically Signed   TCW/MedQ  DD: 12/11/2008  DT: 12/12/2008  Job #: [id number]   cc:   Weston Settle, MD

## 2011-09-22 ENCOUNTER — Encounter: Payer: Self-pay | Admitting: Cardiology

## 2011-12-16 ENCOUNTER — Emergency Department (HOSPITAL_COMMUNITY): Payer: Medicare Other

## 2011-12-16 ENCOUNTER — Emergency Department (HOSPITAL_COMMUNITY)
Admission: EM | Admit: 2011-12-16 | Discharge: 2011-12-16 | Disposition: A | Discharge status: Home / Self Care | Payer: Medicare Other | Attending: Emergency Medicine | Admitting: Emergency Medicine

## 2011-12-16 ENCOUNTER — Encounter (HOSPITAL_COMMUNITY): Payer: Self-pay | Admitting: Emergency Medicine

## 2011-12-16 DIAGNOSIS — W1809XA Striking against other object with subsequent fall, initial encounter: Secondary | ICD-10-CM | POA: Insufficient documentation

## 2011-12-16 DIAGNOSIS — I129 Hypertensive chronic kidney disease with stage 1 through stage 4 chronic kidney disease, or unspecified chronic kidney disease: Secondary | ICD-10-CM | POA: Insufficient documentation

## 2011-12-16 DIAGNOSIS — E785 Hyperlipidemia, unspecified: Secondary | ICD-10-CM | POA: Insufficient documentation

## 2011-12-16 DIAGNOSIS — N183 Chronic kidney disease, stage 3 unspecified: Secondary | ICD-10-CM | POA: Insufficient documentation

## 2011-12-16 DIAGNOSIS — Z79899 Other long term (current) drug therapy: Secondary | ICD-10-CM | POA: Insufficient documentation

## 2011-12-16 DIAGNOSIS — S93409A Sprain of unspecified ligament of unspecified ankle, initial encounter: Secondary | ICD-10-CM | POA: Insufficient documentation

## 2011-12-16 DIAGNOSIS — E119 Type 2 diabetes mellitus without complications: Secondary | ICD-10-CM | POA: Insufficient documentation

## 2011-12-16 DIAGNOSIS — IMO0002 Reserved for concepts with insufficient information to code with codable children: Secondary | ICD-10-CM

## 2011-12-16 DIAGNOSIS — Y92009 Unspecified place in unspecified non-institutional (private) residence as the place of occurrence of the external cause: Secondary | ICD-10-CM | POA: Insufficient documentation

## 2011-12-16 NOTE — ED Notes (Signed)
Fell twisting ankle last Saturday while trying to catch a spider. Pt has been treating at home with soaking.

## 2011-12-16 NOTE — Discharge Instructions (Signed)
Elevate, continue the warm soaks. Wear the ankle support for comfort and to prevent further injury to your ankle. Use your cane as needed for walking. You can take tylenol 650 mg every 6hrs for pain. Recheck if you still have problems next week with Dr Luna Glasgow, the orthopedist on call today.

## 2011-12-16 NOTE — ED Provider Notes (Cosign Needed)
History   This chart was scribed for Lynn Norrie, MD by Reece Agar. The patient was seen in room APA10/APA10. Patient's care was started at [facility].   CSN: [id number]  Arrival date & time 12/16/11  0818   First MD Initiated Contact with Patient 12/16/11 0827      Chief Complaint  Patient presents with  . Ankle Pain  . Fall    (Consider location/radiation/quality/duration/timing/severity/associated sxs/prior treatment) HPI  Lynn Mcclure is a 78 y.o. female who presents to the Emergency Department complaining of an episode of moderate pain in the right ankle,  Patient was attempting to catch spider on knees about 4 days ago and fell when rising, hearing two "pops" within her right ankle. She reports she fell the next day for no apparent reason. States the last time she fell was a year ago. The first fall she hit her head on the TV.  Patient denies subsequent HA or loss of consciousness. Denies numbness, tingling in extremities. Patient is currently able to ambulate with cane without pain. She relates she has chronic stiffness, especially in the morning her her knees. She also states she is having "charlie horses" in her calves since she fell. She has been soaking her ankle in warm water and states she did have bruising over the lateral malleolus with swelling which has resolved.   Dr Legrand Rams  Past Medical History  Diagnosis Date  . Diabetes mellitus   . Hyperlipidemia   . Hypertension   . Normocytic anemia   . CKD (chronic kidney disease), stage III   . Seborrheic keratosis   . Osteoarthritis     History reviewed. No pertinent past surgical history.  Family History  Problem Relation Age of Onset  . Heart attack Mother   . Anemia Sister   . Diabetes Sister   . Coronary artery disease Brother     History  Substance Use Topics  . Smoking status: Never Smoker   . Smokeless tobacco: Not on file  . Alcohol Use: No  lives at home  OB History    Grav Para Term Preterm  Abortions TAB SAB Ect Mult Living                  Review of Systems 10 Systems reviewed and are negative for acute change except as noted in the HPI.  Allergies  Atenolol; Etodolac; Furosemide; Red dye; and Rosiglitazone-glimepiride  Home Medications   Current Outpatient Rx  Name Route Sig Dispense Refill  . ATORVASTATIN CALCIUM 20 MG PO TABS Oral Take 20 mg by mouth daily.    Marland Kitchen GLIPIZIDE 5 MG PO TABS Oral Take 5 mg by mouth daily.      . IRON COMBINATIONS PO ELIX Oral Take 5 mLs by mouth 2 (two) times daily. OTC med SSS tonic    . ISOSORBIDE MONONITRATE 10 MG PO TABS Oral Take 10 mg by mouth 2 (two) times daily.      Marland Kitchen LISINOPRIL 20 MG PO TABS Oral Take 20 mg by mouth daily.      Marland Kitchen METOPROLOL TARTRATE 25 MG PO TABS Oral Take 25 mg by mouth 2 (two) times daily.      Marland Kitchen POTASSIUM CHLORIDE CRYS ER 10 MEQ PO TBCR Oral Take 10 mEq by mouth daily.        BP 143/106  Pulse 84  Temp(Src) 98.5 F (36.9 C) (Oral)  Resp 18  Ht 5\' 7"  (1.702 m)  Wt 200 lb (90.719 kg)  BMI 31.32 kg/m2  SpO2 98%  Vital signs normal hypertension   Physical Exam  Nursing note and vitals reviewed. Constitutional: She is oriented to person, place, and time. She appears well-developed and well-nourished. No distress.  HENT:  Head: Normocephalic and atraumatic.  Right Ear: External ear normal.  Left Ear: External ear normal.  Nose: Nose normal.  Eyes: Conjunctivae and EOM are normal. Pupils are equal, round, and reactive to light.  Neck: Normal range of motion. Neck supple. No tracheal deviation present.  Pulmonary/Chest: Effort normal. No respiratory distress.  Musculoskeletal: Normal range of motion.       Mild tenderness in lateral malleolus of right foot. Mild tenderness to proximal aspect of right foot. No TTP to bilateral knees with no effusion or redness. Calves soft and nontender to palpation.  DP and PT pulses intact.  Neurological: She is alert and oriented to person, place, and time. No  sensory deficit.  Skin: Skin is warm and dry.  Psychiatric: She has a normal mood and affect. Her behavior is normal.    ED Course  Procedures (including critical care time) DIAGNOSTIC STUDIES: Oxygen Saturation is 98% on room air, normal by my interpretation.    COORDINATION OF CARE: 8:33AM-Patient informed of current plan for treatment and evaluation and agrees with plan at this time.   Patient relates she had some pain yesterday, she states not hurting as bad today. She's tried no OTC meds. She states aspirin makes her bruise easily. She states she has never tried Tylenol.  ASO placed on her right ankle.   Labs Reviewed - No data to display Dg Tibia/fibula Right  12/16/2011  *RADIOLOGY REPORT*  Clinical Data: Ankle pain, fall.  RIGHT TIBIA AND FIBULA - 2 VIEW  Comparison: None  Findings: Diffuse right ankle soft tissue swelling. No acute bony abnormality.  Specifically, no fracture, subluxation, or dislocation.  Soft tissues are intact.  IMPRESSION: No acute bony abnormality.  Original Report Authenticated By: Raelyn Number, M.D.   Dg Ankle Complete Right  12/16/2011  *RADIOLOGY REPORT*  Clinical Data: Fall, ankle pain.  RIGHT ANKLE - COMPLETE 3+ VIEW  Comparison: 12/16/2011  Findings: Diffuse soft tissue swelling about the ankle.  No underlying bony abnormality.  No fracture, subluxation or dislocation.  IMPRESSION: Soft tissue swelling.  No acute bony abnormality.  Original Report Authenticated By: Raelyn Number, M.D.     1. Sprain of ankle or foot, right    Plan discharge Rolland Porter, MD, FACEP    MDM    I personally performed the services described in this documentation, which was scribed in my presence. The recorded information has been reviewed and considered. Rolland Porter, MD, FACEP       Lynn Norrie, MD 12/16/11 [phone]

## 2012-09-22 HISTORY — PX: COLONOSCOPY: SHX174

## 2014-10-26 DIAGNOSIS — E119 Type 2 diabetes mellitus without complications: Secondary | ICD-10-CM | POA: Diagnosis not present

## 2014-10-26 DIAGNOSIS — I10 Essential (primary) hypertension: Secondary | ICD-10-CM | POA: Diagnosis not present

## 2014-10-26 DIAGNOSIS — E785 Hyperlipidemia, unspecified: Secondary | ICD-10-CM | POA: Diagnosis not present

## 2014-12-20 ENCOUNTER — Emergency Department (HOSPITAL_COMMUNITY): Payer: Medicare Other

## 2014-12-20 ENCOUNTER — Encounter (HOSPITAL_COMMUNITY): Payer: Self-pay | Admitting: Emergency Medicine

## 2014-12-20 ENCOUNTER — Emergency Department (HOSPITAL_COMMUNITY)
Admission: EM | Admit: 2014-12-20 | Discharge: 2014-12-20 | Disposition: A | Discharge status: Home / Self Care | Payer: Medicare Other | Attending: Emergency Medicine | Admitting: Emergency Medicine

## 2014-12-20 DIAGNOSIS — Z79899 Other long term (current) drug therapy: Secondary | ICD-10-CM | POA: Diagnosis not present

## 2014-12-20 DIAGNOSIS — Z872 Personal history of diseases of the skin and subcutaneous tissue: Secondary | ICD-10-CM | POA: Insufficient documentation

## 2014-12-20 DIAGNOSIS — M7989 Other specified soft tissue disorders: Secondary | ICD-10-CM | POA: Diagnosis not present

## 2014-12-20 DIAGNOSIS — M1990 Unspecified osteoarthritis, unspecified site: Secondary | ICD-10-CM | POA: Diagnosis not present

## 2014-12-20 DIAGNOSIS — M25474 Effusion, right foot: Secondary | ICD-10-CM | POA: Diagnosis not present

## 2014-12-20 DIAGNOSIS — E785 Hyperlipidemia, unspecified: Secondary | ICD-10-CM | POA: Diagnosis not present

## 2014-12-20 DIAGNOSIS — R609 Edema, unspecified: Secondary | ICD-10-CM

## 2014-12-20 DIAGNOSIS — N183 Chronic kidney disease, stage 3 (moderate): Secondary | ICD-10-CM | POA: Insufficient documentation

## 2014-12-20 DIAGNOSIS — R600 Localized edema: Secondary | ICD-10-CM | POA: Diagnosis not present

## 2014-12-20 DIAGNOSIS — M79604 Pain in right leg: Secondary | ICD-10-CM | POA: Diagnosis not present

## 2014-12-20 DIAGNOSIS — I129 Hypertensive chronic kidney disease with stage 1 through stage 4 chronic kidney disease, or unspecified chronic kidney disease: Secondary | ICD-10-CM | POA: Diagnosis not present

## 2014-12-20 DIAGNOSIS — E119 Type 2 diabetes mellitus without complications: Secondary | ICD-10-CM | POA: Diagnosis not present

## 2014-12-20 DIAGNOSIS — Z862 Personal history of diseases of the blood and blood-forming organs and certain disorders involving the immune mechanism: Secondary | ICD-10-CM | POA: Insufficient documentation

## 2014-12-20 DIAGNOSIS — M79671 Pain in right foot: Secondary | ICD-10-CM | POA: Insufficient documentation

## 2014-12-20 DIAGNOSIS — M19071 Primary osteoarthritis, right ankle and foot: Secondary | ICD-10-CM | POA: Diagnosis not present

## 2014-12-20 NOTE — ED Provider Notes (Signed)
CSN: [id number]     Arrival date & time 12/20/14  1040 History  This chart was scribed for Lynn Fraise, MD by Einar Pheasant, Medical Scribe. This patient was seen in room APA08/APA08 and the patient's care was started at 11:22 AM.    Chief Complaint  Patient presents with  . Foot Pain   Patient is a 81 y.o. female presenting with lower extremity pain. The history is provided by the patient and medical records. No language interpreter was used.  Foot Pain This is a new problem. The current episode started more than 2 days ago. The problem occurs rarely. The problem has not changed since onset.Pertinent negatives include no chest pain, no abdominal pain, no headaches and no shortness of breath. The symptoms are aggravated by walking. Nothing relieves the symptoms. She has tried a warm compress and acetaminophen (epson salt soaks) for the symptoms. The treatment provided no relief.   HPI Comments: Lynn Mcclure is a 81 y.o. female with PMhx of DM presents to the Emergency Department complaining of sudden onset right foot pain that started approximately 5 days ago. Pt reports soaking the foot in Epson salt, taking 2 Ibuprofen, and applying warm compresses without adequate relief or her pain. She did notice some swelling to the affected foot 2 days ago but the associated swelling has gone down after doing a few Epson salt soaks. Pain is exacerbated by bearing weight. Pt denies any fever, CP, cough, SOB, abdominal pain, nausea,  HA, weakness, numbness and rash as associated symptoms.    Past Medical History  Diagnosis Date  . Diabetes mellitus   . Hyperlipidemia   . Hypertension   . Normocytic anemia   . CKD (chronic kidney disease), stage III   . Seborrheic keratosis   . Osteoarthritis    History reviewed. No pertinent past surgical history. Family History  Problem Relation Age of Onset  . Heart attack Mother   . Anemia Sister   . Diabetes Sister   . Coronary artery disease  Brother    History  Substance Use Topics  . Smoking status: Never Smoker   . Smokeless tobacco: Not on file  . Alcohol Use: No   OB History    No data available     Review of Systems  Constitutional: Negative for fever.  Respiratory: Negative for shortness of breath.   Cardiovascular: Negative for chest pain.  Gastrointestinal: Negative for abdominal pain.  Musculoskeletal: Positive for joint swelling and arthralgias. Negative for myalgias.  Neurological: Negative for weakness, numbness and headaches.  All other systems reviewed and are negative.  Allergies  Atenolol; Etodolac; Furosemide; Red dye; Rosiglitazone-glimepiride; and Asa  Home Medications   Prior to Admission medications   Medication Sig Start Date End Date Taking? Authorizing Provider  acetaminophen (TYLENOL) 500 MG tablet Take 500 mg by mouth every 6 (six) hours as needed for mild pain.   Yes Historical Provider, MD  atorvastatin (LIPITOR) 20 MG tablet Take 20 mg by mouth daily.   Yes Historical Provider, MD  glyBURIDE (DIABETA) 5 MG tablet Take 5 mg by mouth daily with breakfast.   Yes Historical Provider, MD  isosorbide mononitrate (ISMO,MONOKET) 10 MG tablet Take 10 mg by mouth 2 (two) times daily.     Yes Historical Provider, MD  lisinopril (PRINIVIL,ZESTRIL) 10 MG tablet Take 2 tablets by mouth daily.  12/19/14  Yes Historical Provider, MD  metoprolol tartrate (LOPRESSOR) 25 MG tablet Take 25 mg by mouth 2 (two) times daily.  Yes Historical Provider, MD  potassium chloride (K-DUR) 10 MEQ tablet Take 1 tablet by mouth daily. 12/19/14  Yes Historical Provider, MD   BP 136/60 mmHg  Pulse 66  Temp(Src) 98 F (36.7 C) (Oral)  Resp 20  Ht 5\' 7"  (1.702 m)  Wt 197 lb (89.359 kg)  BMI 30.85 kg/m2  SpO2 98%  Physical Exam  Nursing note and vitals reviewed. CONSTITUTIONAL: Well developed/well nourished HEAD: Normocephalic/atraumatic EYES: EOMI/PERRL ENMT: Mucous membranes moist NECK: supple no meningeal  signs SPINE/BACK:entire spine nontender CV: S1/S2 noted, no murmurs/rubs/gallops noted LUNGS: Lungs are clear to auscultation bilaterally, no apparent distress ABDOMEN: soft, nontender, no rebound or guarding, bowel sounds noted throughout abdomen GU:no cva tenderness NEURO: Pt is awake/alert/appropriate, moves all extremitiesx4.  No facial droop.   EXTREMITIES: pulses normal/equal, full ROM; chronic lower extremity edema right greater than left. Mild tenderness to dorsal aspect of right foot. No wounds noted to foot. No puncture wounds to toes/plantar surface of foot.  No abscess SKIN: warm, color normal PSYCH: no abnormalities of mood noted, alert and oriented to situation   ED Course  Procedures  DIAGNOSTIC STUDIES: Oxygen Saturation is 98% on RA, normal by my interpretation.    COORDINATION OF CARE: 11:29 AM- Pt advised of plan for treatment and pt agrees.  Imaging Review US Venous Img Lower Unilateral Right  12/20/2014   CLINICAL DATA:  Right lower extremity swelling pain.  EXAM: Right LOWER EXTREMITY VENOUS DOPPLER ULTRASOUND  TECHNIQUE: Gray-scale sonography with graded compression, as well as color Doppler and duplex ultrasound were performed to evaluate the lower extremity deep venous systems from the level of the common femoral vein and including the common femoral, femoral, profunda femoral, popliteal and calf veins including the posterior tibial, peroneal and gastrocnemius veins when visible. The superficial great saphenous vein was also interrogated. Spectral Doppler was utilized to evaluate flow at rest and with distal augmentation maneuvers in the common femoral, femoral and popliteal veins.  COMPARISON:  None.  FINDINGS: Contralateral Common Femoral Vein: Respiratory phasicity is normal and symmetric with the symptomatic side. No evidence of thrombus. Normal compressibility.  Common Femoral Vein: No evidence of thrombus. Normal compressibility, respiratory phasicity and  response to augmentation.  Saphenofemoral Junction: No evidence of thrombus. Normal compressibility and flow on color Doppler imaging.  Profunda Femoral Vein: No evidence of thrombus. Normal compressibility and flow on color Doppler imaging.  Femoral Vein: No evidence of thrombus. Normal compressibility, respiratory phasicity and response to augmentation.  Popliteal Vein: No evidence of thrombus. Normal compressibility, respiratory phasicity and response to augmentation.  Calf Veins: No evidence of thrombus. Normal compressibility and flow on color Doppler imaging.  Superficial Great Saphenous Vein: No evidence of thrombus. Normal compressibility and flow on color Doppler imaging.  Venous Reflux:  None.  Other Findings:  None.  IMPRESSION: No evidence of deep venous thrombosis.   Electronically Signed   By: Marcello Moores  Register   On: 12/20/2014 12:28   Dg Foot Complete Right  12/20/2014   CLINICAL DATA:  Right foot pain.  No known injury.  EXAM: RIGHT FOOT COMPLETE - 3+ VIEW  COMPARISON:  Ankle radiographs dated 12/16/2011  FINDINGS: There are minimal degenerative changes at the first metatarsal phalangeal joint. The osseous structures of the foot are otherwise normal. No joint effusions.  IMPRESSION: Minimal degenerative changes at the first metatarsal phalangeal joint.   Electronically Signed   By: Lorriane Shire M.D.   On: 12/20/2014 12:30    Imaging negative Offered post op  shoe Referred to ortho Well appearing No evidence of septic joint/cellulitis  MDM   Final diagnoses:  Edema  Edema of right lower extremity  Pain in right foot    Nursing notes including past medical history and social history reviewed and considered in documentation xrays/imaging reviewed by myself and considered during evaluation    I personally performed the services described in this documentation, which was scribed in my presence. The recorded information has been reviewed and is accurate.      Lynn Fraise,  MD 12/20/14 [phone]

## 2014-12-20 NOTE — ED Notes (Signed)
Onset Friday- right foot pain, had a knot on it, denies injury, pt has been taking tylenol and soaking foot, states it is better, but concerned about it. Pt has Diabetes

## 2015-01-30 DIAGNOSIS — I10 Essential (primary) hypertension: Secondary | ICD-10-CM | POA: Diagnosis not present

## 2015-01-30 DIAGNOSIS — E119 Type 2 diabetes mellitus without complications: Secondary | ICD-10-CM | POA: Diagnosis not present

## 2015-01-30 DIAGNOSIS — E785 Hyperlipidemia, unspecified: Secondary | ICD-10-CM | POA: Diagnosis not present

## 2015-05-08 DIAGNOSIS — E1165 Type 2 diabetes mellitus with hyperglycemia: Secondary | ICD-10-CM | POA: Diagnosis not present

## 2015-05-08 DIAGNOSIS — E039 Hypothyroidism, unspecified: Secondary | ICD-10-CM | POA: Diagnosis not present

## 2015-05-08 DIAGNOSIS — E785 Hyperlipidemia, unspecified: Secondary | ICD-10-CM | POA: Diagnosis not present

## 2015-05-08 DIAGNOSIS — I10 Essential (primary) hypertension: Secondary | ICD-10-CM | POA: Diagnosis not present

## 2015-05-08 DIAGNOSIS — E780 Pure hypercholesterolemia: Secondary | ICD-10-CM | POA: Diagnosis not present

## 2015-05-08 DIAGNOSIS — E119 Type 2 diabetes mellitus without complications: Secondary | ICD-10-CM | POA: Diagnosis not present

## 2015-05-29 DIAGNOSIS — E1165 Type 2 diabetes mellitus with hyperglycemia: Secondary | ICD-10-CM | POA: Diagnosis not present

## 2015-07-31 DIAGNOSIS — I10 Essential (primary) hypertension: Secondary | ICD-10-CM | POA: Diagnosis not present

## 2015-07-31 DIAGNOSIS — E785 Hyperlipidemia, unspecified: Secondary | ICD-10-CM | POA: Diagnosis not present

## 2015-07-31 DIAGNOSIS — E119 Type 2 diabetes mellitus without complications: Secondary | ICD-10-CM | POA: Diagnosis not present

## 2015-09-11 DIAGNOSIS — H2513 Age-related nuclear cataract, bilateral: Secondary | ICD-10-CM | POA: Diagnosis not present

## 2015-09-11 DIAGNOSIS — E119 Type 2 diabetes mellitus without complications: Secondary | ICD-10-CM | POA: Diagnosis not present

## 2015-10-08 NOTE — Patient Instructions (Signed)
Your procedure is scheduled on: 10/16/2015  Report to [facility] at  0700 AM.  Call this number if you have problems the morning of surgery: [phone]   Do not eat food or drink liquids :After Midnight.      Take these medicines the morning of surgery with A SIP OF WATER: isosorbide, lisinopril, metoprolol.   Do not wear jewelry, make-up or nail polish.  Do not wear lotions, powders, or perfumes. You may wear deodorant.  Do not shave 48 hours prior to surgery.  Do not bring valuables to the hospital.  Contacts, dentures or bridgework may not be worn into surgery.  Leave suitcase in the car. After surgery it may be brought to your room.  For patients admitted to the hospital, checkout time is 11:00 AM the day of discharge.   Patients discharged the day of surgery will not be allowed to drive home.  :     Please read over the following fact sheets that you were given: Coughing and Deep Breathing, Surgical Site Infection Prevention, Anesthesia Post-op Instructions and Care and Recovery After Surgery    Cataract A cataract is a clouding of the lens of the eye. When a lens becomes cloudy, vision is reduced based on the degree and nature of the clouding. Many cataracts reduce vision to some degree. Some cataracts make people more near-sighted as they develop. Other cataracts increase glare. Cataracts that are ignored and become worse can sometimes look white. The white color can be seen through the pupil. CAUSES   Aging. However, cataracts may occur at any age, even in newborns.   Certain drugs.   Trauma to the eye.   Certain diseases such as diabetes.   Specific eye diseases such as chronic inflammation inside the eye or a sudden attack of a rare form of glaucoma.   Inherited or acquired medical problems.  SYMPTOMS   Gradual, progressive drop in vision in the affected eye.   Severe, rapid visual loss. This most often happens when trauma is the cause.  DIAGNOSIS  To detect a  cataract, an eye doctor examines the lens. Cataracts are best diagnosed with an exam of the eyes with the pupils enlarged (dilated) by drops.  TREATMENT  For an early cataract, vision may improve by using different eyeglasses or stronger lighting. If that does not help your vision, surgery is the only effective treatment. A cataract needs to be surgically removed when vision loss interferes with your everyday activities, such as driving, reading, or watching TV. A cataract may also have to be removed if it prevents examination or treatment of another eye problem. Surgery removes the cloudy lens and usually replaces it with a substitute lens (intraocular lens, IOL).  At a time when both you and your doctor agree, the cataract will be surgically removed. If you have cataracts in both eyes, only one is usually removed at a time. This allows the operated eye to heal and be out of danger from any possible problems after surgery (such as infection or poor wound healing). In rare cases, a cataract may be doing damage to your eye. In these cases, your caregiver may advise surgical removal right away. The vast majority of people who have cataract surgery have better vision afterward. HOME CARE INSTRUCTIONS  If you are not planning surgery, you may be asked to do the following:  Use different eyeglasses.   Use stronger or brighter lighting.   Ask your eye doctor about reducing your medicine dose  or changing medicines if it is thought that a medicine caused your cataract. Changing medicines does not make the cataract go away on its own.   Become familiar with your surroundings. Poor vision can lead to injury. Avoid bumping into things on the affected side. You are at a higher risk for tripping or falling.   Exercise extreme care when driving or operating machinery.   Wear sunglasses if you are sensitive to bright light or experiencing problems with glare.  SEEK IMMEDIATE MEDICAL CARE IF:   You have a  worsening or sudden vision loss.   You notice redness, swelling, or increasing pain in the eye.   You have a fever.  Document Released: 09/08/2005 Document Revised: 08/28/2011 Document Reviewed: 05/02/2011 [facility] Patient Information 2012 [place].PATIENT INSTRUCTIONS POST-ANESTHESIA  IMMEDIATELY FOLLOWING SURGERY:  Do not drive or operate machinery for the first twenty four hours after surgery.  Do not make any important decisions for twenty four hours after surgery or while taking narcotic pain medications or sedatives.  If you develop intractable nausea and vomiting or a severe headache please notify your doctor immediately.  FOLLOW-UP:  Please make an appointment with your surgeon as instructed. You do not need to follow up with anesthesia unless specifically instructed to do so.  WOUND CARE INSTRUCTIONS (if applicable):  Keep a dry clean dressing on the anesthesia/puncture wound site if there is drainage.  Once the wound has quit draining you may leave it open to air.  Generally you should leave the bandage intact for twenty four hours unless there is drainage.  If the epidural site drains for more than 36-48 hours please call the anesthesia department.  QUESTIONS?:  Please feel free to call your physician or the hospital operator if you have any questions, and they will be happy to assist you.

## 2015-10-09 ENCOUNTER — Other Ambulatory Visit: Payer: Self-pay

## 2015-10-09 ENCOUNTER — Encounter (HOSPITAL_COMMUNITY)
Admission: RE | Admit: 2015-10-09 | Discharge: 2015-10-09 | Disposition: A | Discharge status: Home / Self Care | Payer: Medicare Other | Source: Ambulatory Visit | Attending: Ophthalmology | Admitting: Ophthalmology

## 2015-10-09 ENCOUNTER — Encounter (HOSPITAL_COMMUNITY): Payer: Self-pay

## 2015-10-09 DIAGNOSIS — Z01812 Encounter for preprocedural laboratory examination: Secondary | ICD-10-CM | POA: Insufficient documentation

## 2015-10-09 DIAGNOSIS — H269 Unspecified cataract: Secondary | ICD-10-CM | POA: Diagnosis not present

## 2015-10-09 LAB — BASIC METABOLIC PANEL
Anion gap: 7 (ref 5–15)
BUN: 20 mg/dL (ref 6–20)
CALCIUM: 8.7 mg/dL — AB (ref 8.9–10.3)
CO2: 23 mmol/L (ref 22–32)
CREATININE: 1.72 mg/dL — AB (ref 0.44–1.00)
Chloride: 110 mmol/L (ref 101–111)
GFR calc non Af Amer: 27 mL/min — ABNORMAL LOW (ref >60)
GFR, EST AFRICAN AMERICAN: 31 mL/min — AB (ref >60)
Glucose, Bld: 156 mg/dL — ABNORMAL HIGH (ref 65–99)
Potassium: 5 mmol/L (ref 3.5–5.1)
SODIUM: 140 mmol/L (ref 135–145)

## 2015-10-09 LAB — CBC
HEMATOCRIT: 31.6 % — AB (ref 36.0–46.0)
Hemoglobin: 10.2 g/dL — ABNORMAL LOW (ref 12.0–15.0)
MCH: 28.4 pg (ref 26.0–34.0)
MCHC: 32.3 g/dL (ref 30.0–36.0)
MCV: 88 fL (ref 78.0–100.0)
PLATELETS: 210 10*3/uL (ref 150–400)
RBC: 3.59 MIL/uL — ABNORMAL LOW (ref 3.87–5.11)
RDW: 13.4 % (ref 11.5–15.5)
WBC: 4.5 10*3/uL (ref 4.0–10.5)

## 2015-10-16 ENCOUNTER — Encounter (HOSPITAL_COMMUNITY): Admission: RE | Payer: Self-pay | Source: Ambulatory Visit

## 2015-10-16 ENCOUNTER — Ambulatory Visit (HOSPITAL_COMMUNITY): Admission: RE | Admit: 2015-10-16 | Payer: Medicare Other | Source: Ambulatory Visit | Admitting: Ophthalmology

## 2015-10-16 SURGERY — PHACOEMULSIFICATION, CATARACT, WITH IOL INSERTION
Anesthesia: Monitor Anesthesia Care | Laterality: Left

## 2015-11-01 ENCOUNTER — Other Ambulatory Visit (HOSPITAL_COMMUNITY): Payer: Medicare Other

## 2015-11-06 ENCOUNTER — Encounter (HOSPITAL_COMMUNITY): Admission: RE | Payer: Self-pay | Source: Ambulatory Visit

## 2015-11-06 ENCOUNTER — Ambulatory Visit (HOSPITAL_COMMUNITY): Admission: RE | Admit: 2015-11-06 | Payer: Medicare Other | Source: Ambulatory Visit | Admitting: Ophthalmology

## 2015-11-06 SURGERY — PHACOEMULSIFICATION, CATARACT, WITH IOL INSERTION
Anesthesia: Monitor Anesthesia Care | Laterality: Right

## 2016-04-14 IMAGING — US US EXTREM LOW VENOUS*R*
1 series · 13 of 24 positions shown · non-contrast
Comparison: None.

CLINICAL DATA: Right lower extremity swelling pain.



[Series 1: us extrem low venous*right* · 0.07mm/px · 13 of 29 slices shown]
[im 1/29]
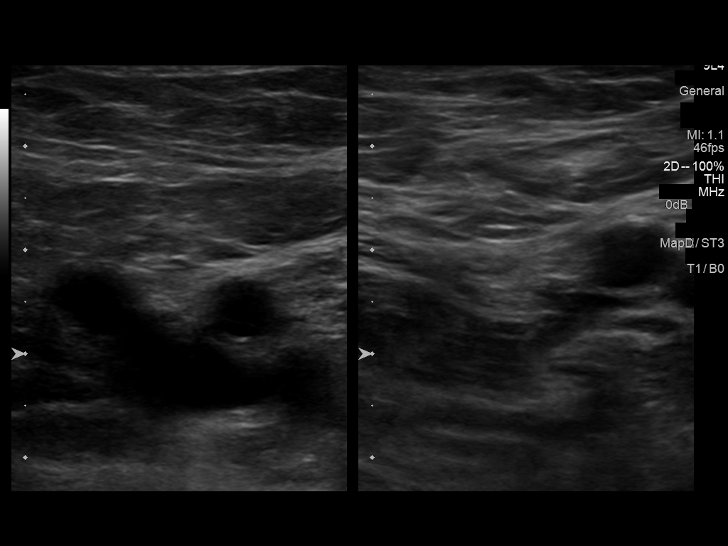
[im 3/29]
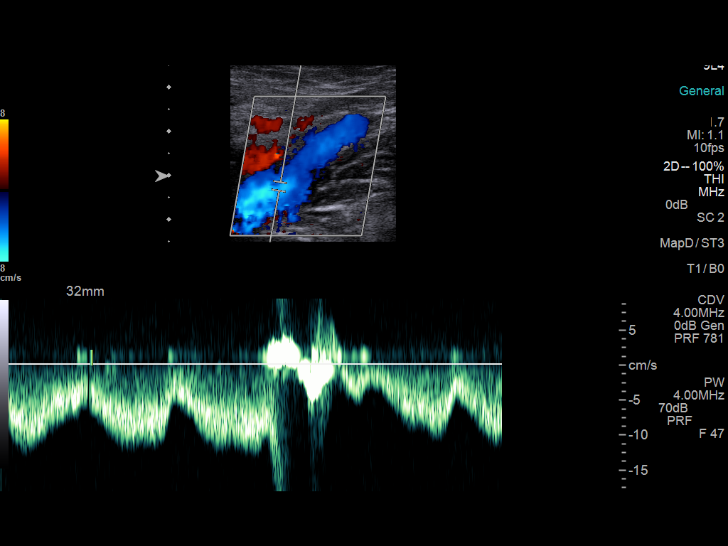
[im 5/29]
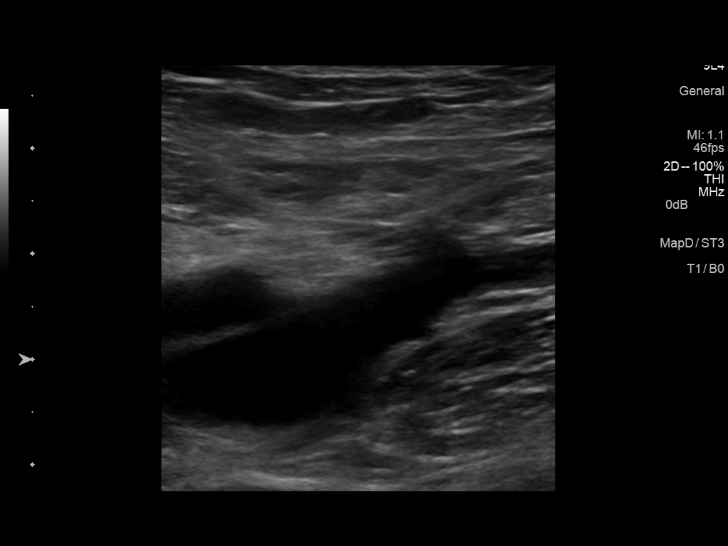
[im 8/29]
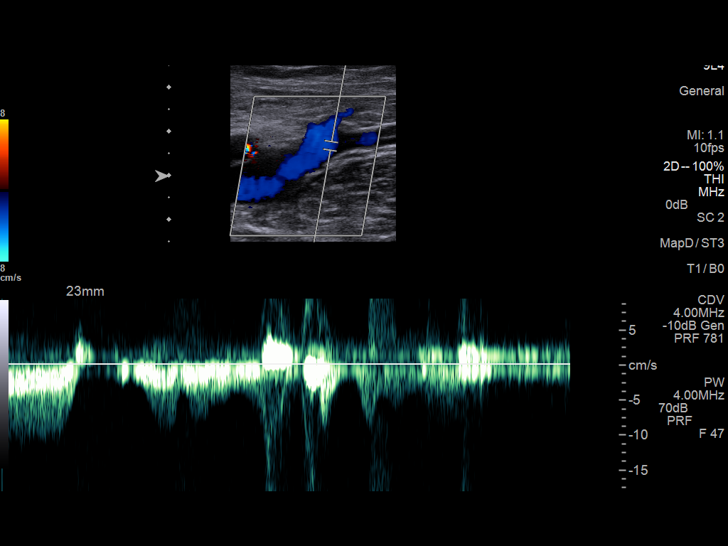
[im 10/29]
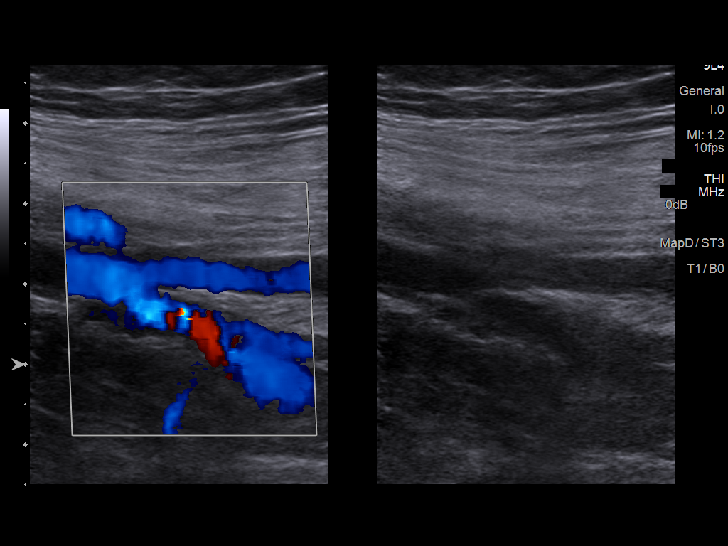
[im 13/29]
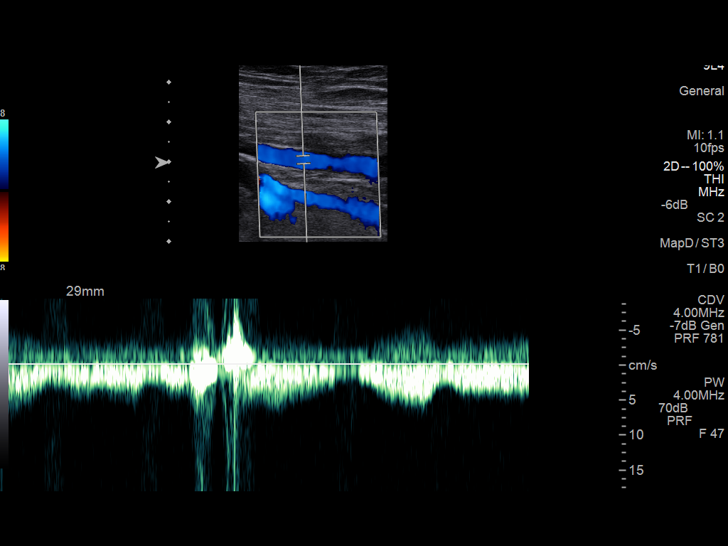
[im 15/29]
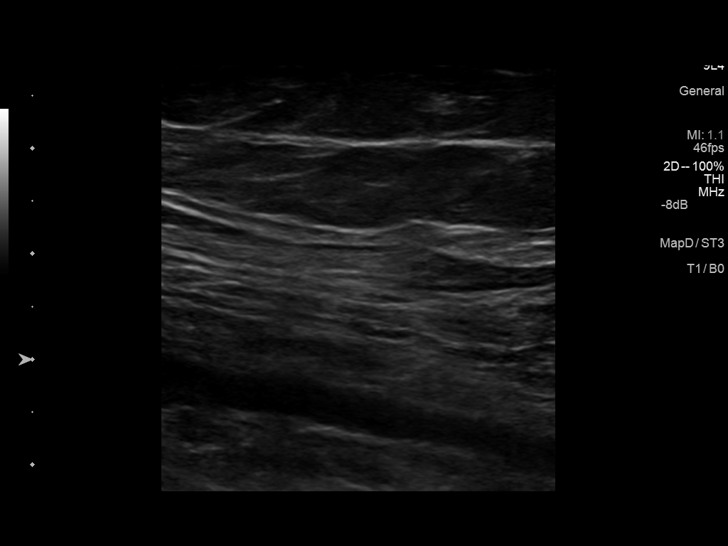
[im 16/29]
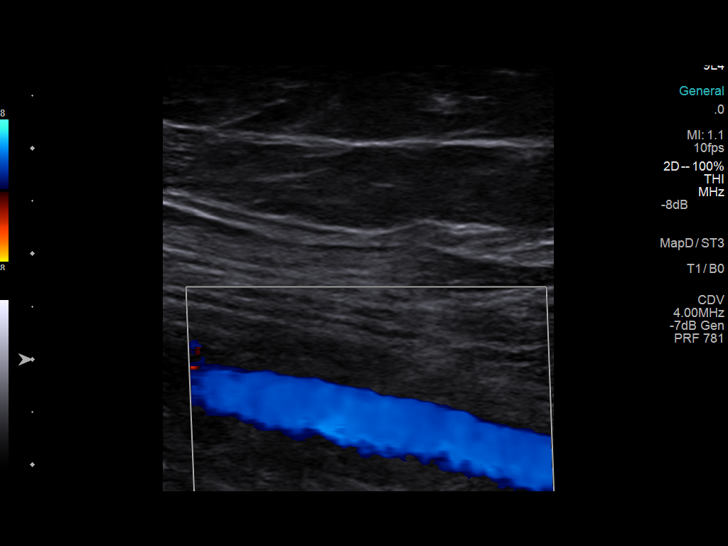
[im 19/29]
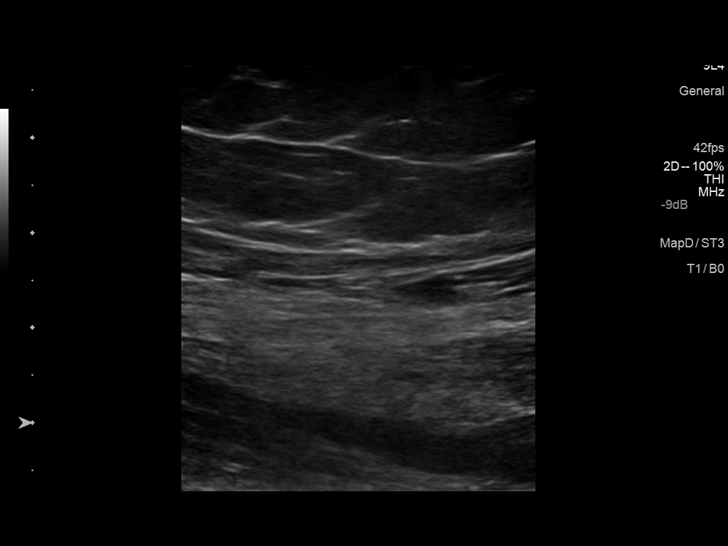
[im 21/29]
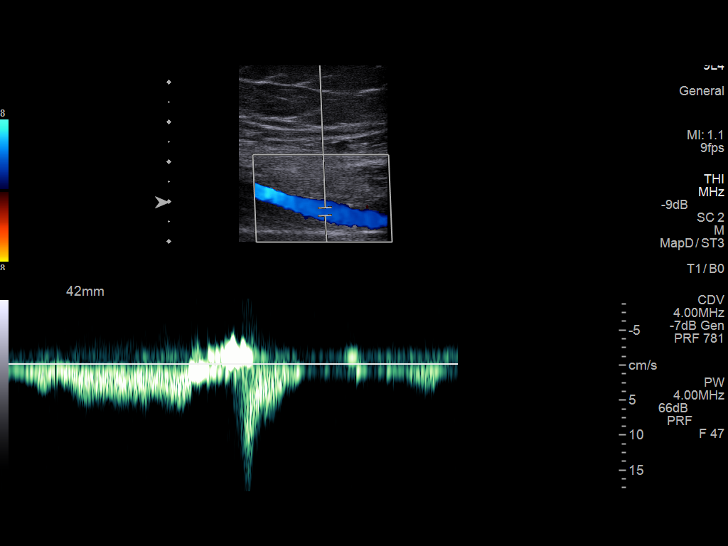
[im 24/29]
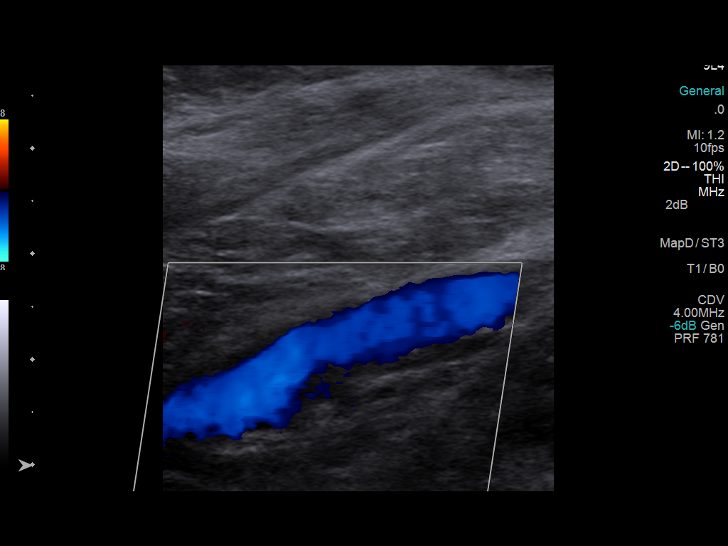
[im 26/29]
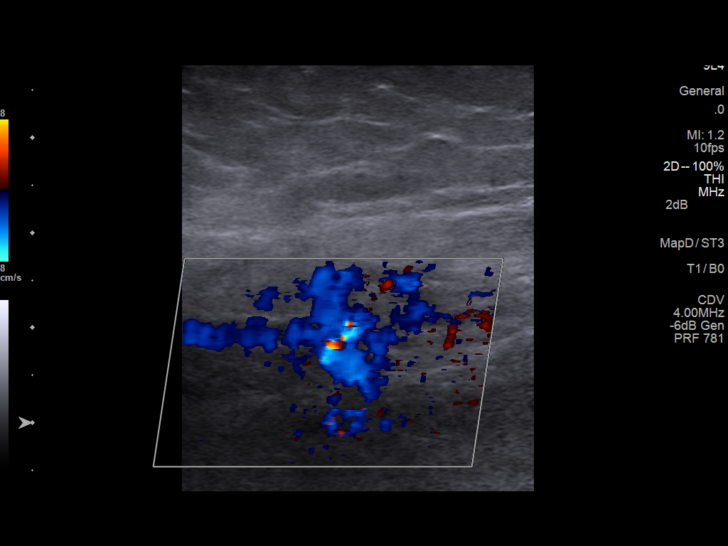
[im 29/29]
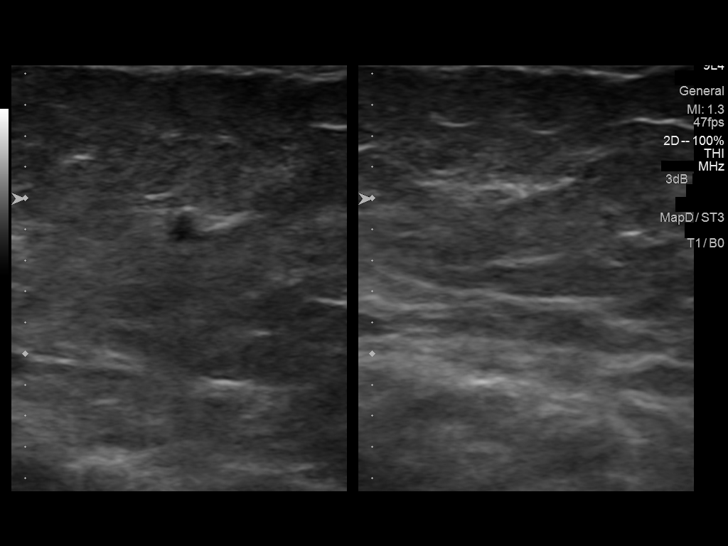

[13 of 24 positions shown; findings below may reference images not displayed]

FINDINGS: Contralateral Common Femoral Vein: Respiratory phasicity is normal
and symmetric with the symptomatic side. No evidence of thrombus.
Normal compressibility.

Common Femoral Vein: No evidence of thrombus. Normal
compressibility, respiratory phasicity and response to augmentation.

Saphenofemoral Junction: No evidence of thrombus. Normal
compressibility and flow on color Doppler imaging.

Profunda Femoral Vein: No evidence of thrombus. Normal
compressibility and flow on color Doppler imaging.

Femoral Vein: No evidence of thrombus. Normal compressibility,
respiratory phasicity and response to augmentation.

Popliteal Vein: No evidence of thrombus. Normal compressibility,
respiratory phasicity and response to augmentation.

Calf Veins: No evidence of thrombus. Normal compressibility and flow
on color Doppler imaging.

Superficial Great Saphenous Vein: No evidence of thrombus. Normal
compressibility and flow on color Doppler imaging.

Venous Reflux:  None.

Other Findings:  None.
IMPRESSION: No evidence of deep venous thrombosis.

## 2016-04-14 IMAGING — DX DG FOOT COMPLETE 3+V*R*
3 series · 3 of 3 positions shown · non-contrast
Comparison: Ankle radiographs dated 12/16/2011

CLINICAL DATA: Right foot pain.  No known injury.

EXAM:
RIGHT FOOT COMPLETE - 3+ VIEW

[foot ap]
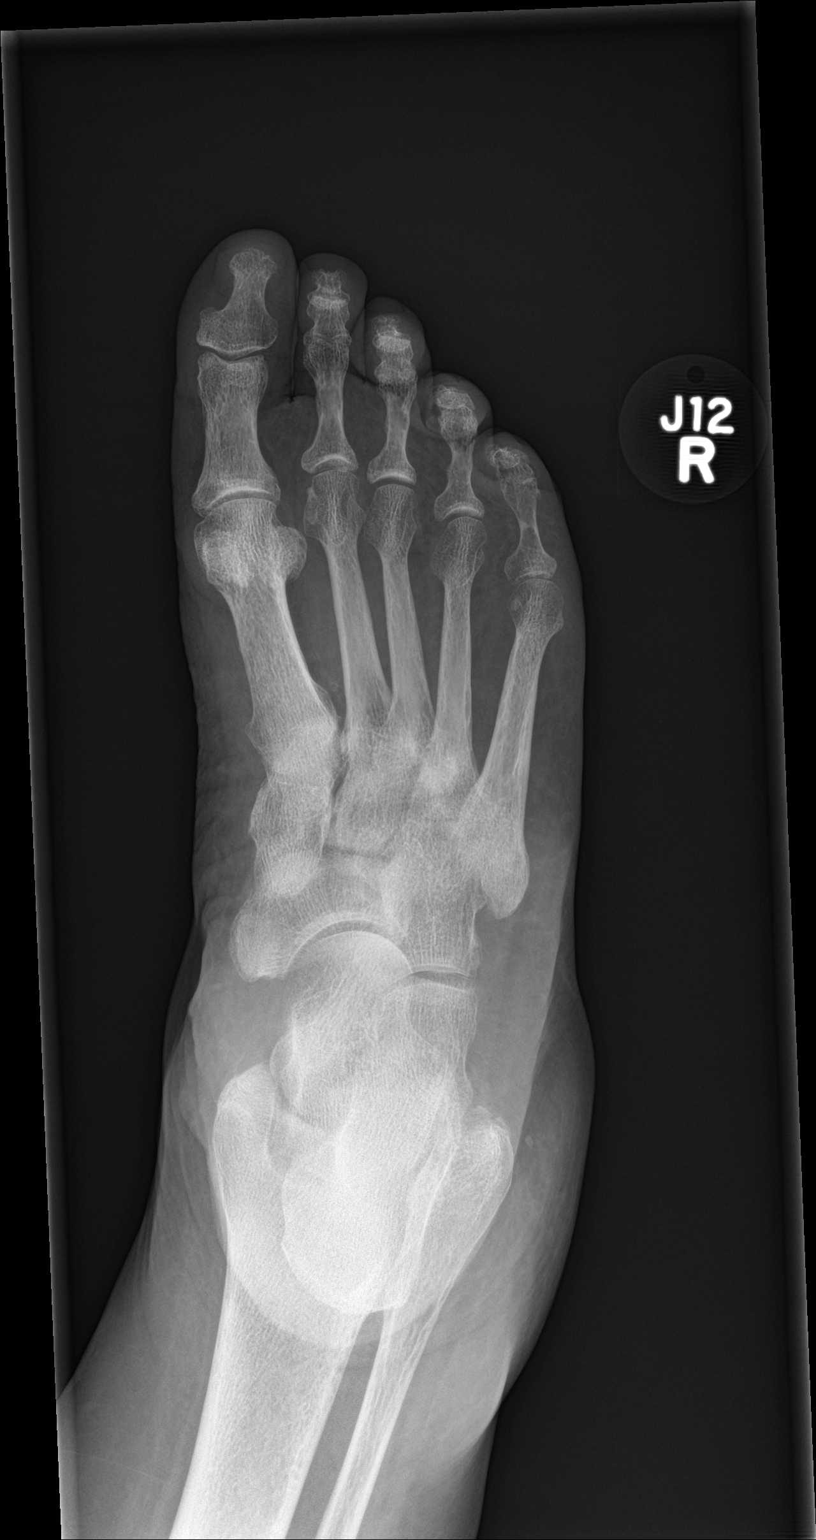

[foot obl]
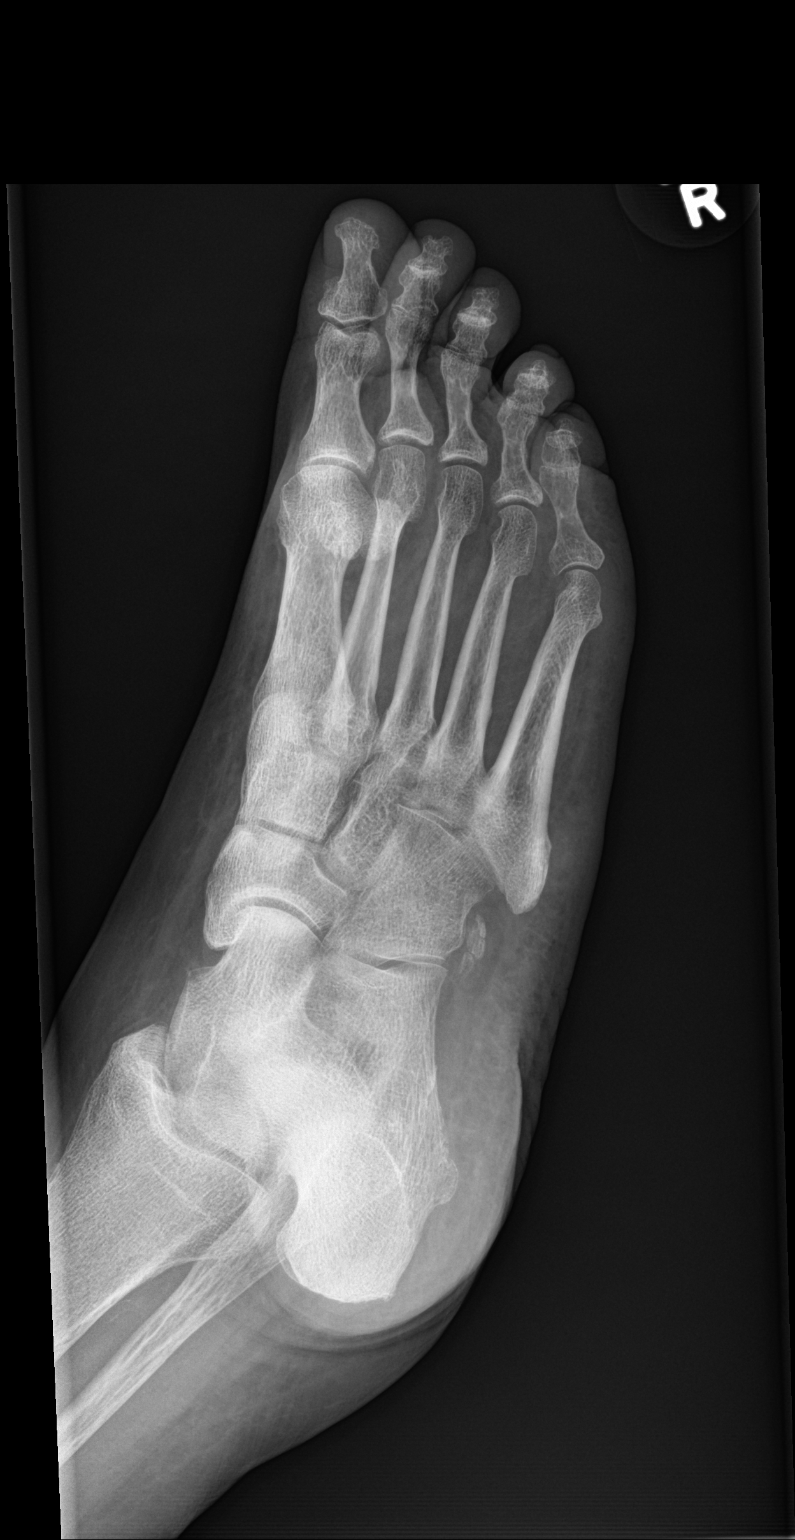

[foot lat]
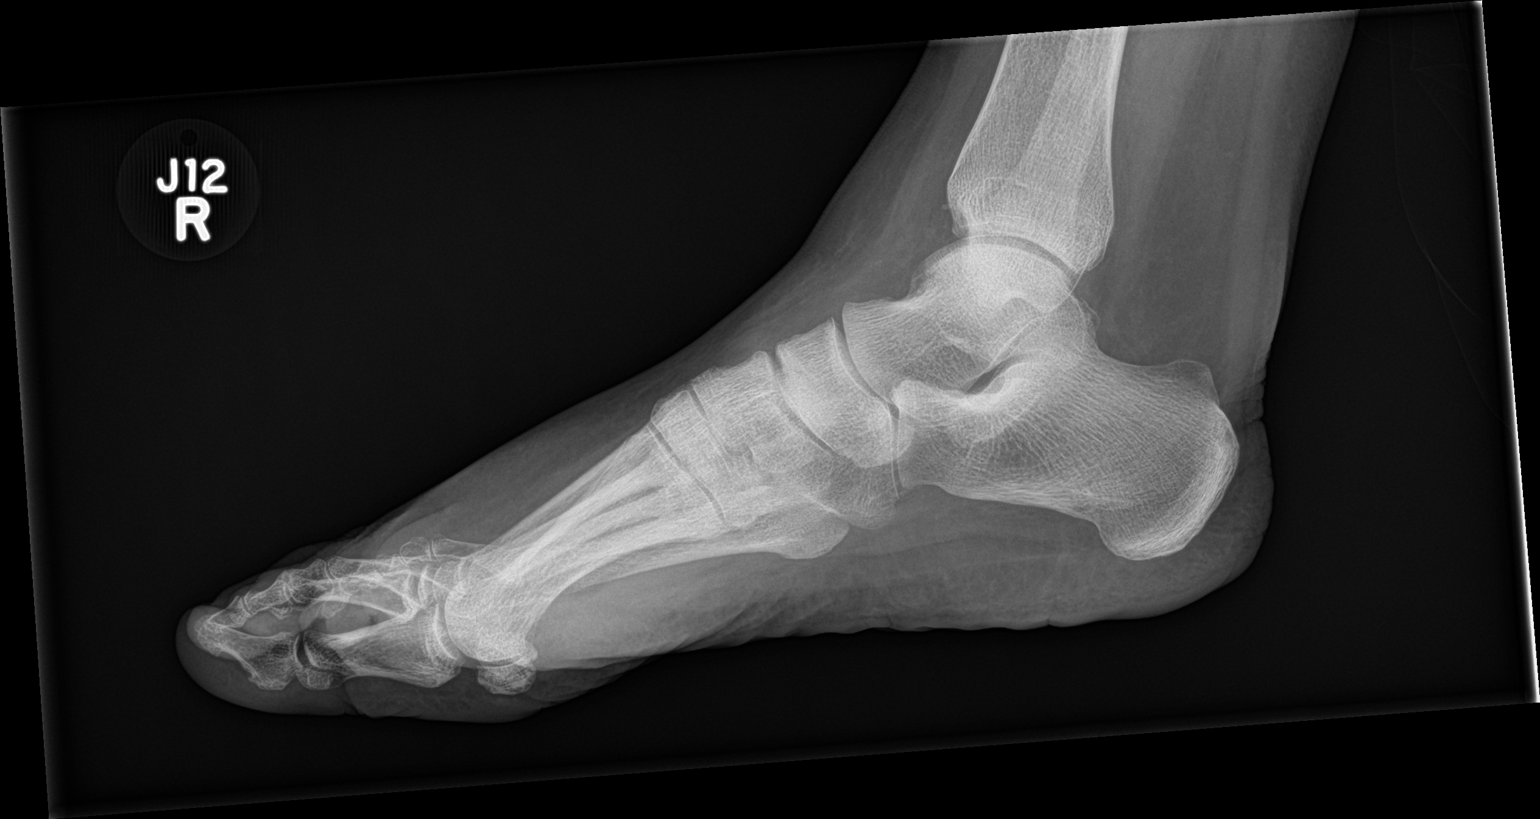

[3 of 3 positions shown; findings below may reference images not displayed]

FINDINGS: There are minimal degenerative changes at the first metatarsal
phalangeal joint. The osseous structures of the foot are otherwise
normal. No joint effusions.
IMPRESSION: Minimal degenerative changes at the first metatarsal phalangeal
joint.

## 2016-10-29 DIAGNOSIS — I10 Essential (primary) hypertension: Secondary | ICD-10-CM | POA: Diagnosis not present

## 2016-10-29 DIAGNOSIS — E119 Type 2 diabetes mellitus without complications: Secondary | ICD-10-CM | POA: Diagnosis not present

## 2017-01-27 DIAGNOSIS — Z1389 Encounter for screening for other disorder: Secondary | ICD-10-CM | POA: Diagnosis not present

## 2017-01-27 DIAGNOSIS — E039 Hypothyroidism, unspecified: Secondary | ICD-10-CM | POA: Diagnosis not present

## 2017-01-27 DIAGNOSIS — Z0000 Encounter for general adult medical examination without abnormal findings: Secondary | ICD-10-CM | POA: Diagnosis not present

## 2017-01-27 DIAGNOSIS — E1165 Type 2 diabetes mellitus with hyperglycemia: Secondary | ICD-10-CM | POA: Diagnosis not present

## 2017-01-27 DIAGNOSIS — I10 Essential (primary) hypertension: Secondary | ICD-10-CM | POA: Diagnosis not present

## 2017-01-27 DIAGNOSIS — E785 Hyperlipidemia, unspecified: Secondary | ICD-10-CM | POA: Diagnosis not present

## 2017-01-27 DIAGNOSIS — E119 Type 2 diabetes mellitus without complications: Secondary | ICD-10-CM | POA: Diagnosis not present

## 2017-04-28 DIAGNOSIS — E119 Type 2 diabetes mellitus without complications: Secondary | ICD-10-CM | POA: Diagnosis not present

## 2017-04-28 DIAGNOSIS — I10 Essential (primary) hypertension: Secondary | ICD-10-CM | POA: Diagnosis not present

## 2017-04-28 DIAGNOSIS — E785 Hyperlipidemia, unspecified: Secondary | ICD-10-CM | POA: Diagnosis not present

## 2017-07-28 DIAGNOSIS — E119 Type 2 diabetes mellitus without complications: Secondary | ICD-10-CM | POA: Diagnosis not present

## 2017-07-28 DIAGNOSIS — I10 Essential (primary) hypertension: Secondary | ICD-10-CM | POA: Diagnosis not present

## 2017-07-28 DIAGNOSIS — E785 Hyperlipidemia, unspecified: Secondary | ICD-10-CM | POA: Diagnosis not present

## 2017-10-27 DIAGNOSIS — E039 Hypothyroidism, unspecified: Secondary | ICD-10-CM | POA: Diagnosis not present

## 2017-10-27 DIAGNOSIS — Z1331 Encounter for screening for depression: Secondary | ICD-10-CM | POA: Diagnosis not present

## 2017-10-27 DIAGNOSIS — Z1389 Encounter for screening for other disorder: Secondary | ICD-10-CM | POA: Diagnosis not present

## 2017-10-27 DIAGNOSIS — Z0001 Encounter for general adult medical examination with abnormal findings: Secondary | ICD-10-CM | POA: Diagnosis not present

## 2017-10-27 DIAGNOSIS — E1165 Type 2 diabetes mellitus with hyperglycemia: Secondary | ICD-10-CM | POA: Diagnosis not present

## 2017-10-27 DIAGNOSIS — Z0000 Encounter for general adult medical examination without abnormal findings: Secondary | ICD-10-CM | POA: Diagnosis not present

## 2017-10-27 DIAGNOSIS — E119 Type 2 diabetes mellitus without complications: Secondary | ICD-10-CM | POA: Diagnosis not present

## 2017-10-27 DIAGNOSIS — I10 Essential (primary) hypertension: Secondary | ICD-10-CM | POA: Diagnosis not present

## 2017-10-27 DIAGNOSIS — E785 Hyperlipidemia, unspecified: Secondary | ICD-10-CM | POA: Diagnosis not present

## 2017-11-13 ENCOUNTER — Other Ambulatory Visit (HOSPITAL_COMMUNITY): Payer: Self-pay | Admitting: Internal Medicine

## 2017-11-13 DIAGNOSIS — N181 Chronic kidney disease, stage 1: Secondary | ICD-10-CM

## 2017-11-19 ENCOUNTER — Ambulatory Visit (HOSPITAL_COMMUNITY): Payer: Medicare Other

## 2017-11-20 ENCOUNTER — Ambulatory Visit (HOSPITAL_COMMUNITY)
Admission: RE | Admit: 2017-11-20 | Discharge: 2017-11-20 | Disposition: A | Discharge status: Home / Self Care | Payer: Medicare Other | Source: Ambulatory Visit | Attending: Internal Medicine | Admitting: Internal Medicine

## 2017-11-20 DIAGNOSIS — N181 Chronic kidney disease, stage 1: Secondary | ICD-10-CM | POA: Diagnosis not present

## 2017-11-20 DIAGNOSIS — N189 Chronic kidney disease, unspecified: Secondary | ICD-10-CM | POA: Diagnosis not present

## 2018-01-26 DIAGNOSIS — I10 Essential (primary) hypertension: Secondary | ICD-10-CM | POA: Diagnosis not present

## 2018-01-26 DIAGNOSIS — N189 Chronic kidney disease, unspecified: Secondary | ICD-10-CM | POA: Diagnosis not present

## 2018-01-26 DIAGNOSIS — E119 Type 2 diabetes mellitus without complications: Secondary | ICD-10-CM | POA: Diagnosis not present

## 2018-04-27 DIAGNOSIS — E119 Type 2 diabetes mellitus without complications: Secondary | ICD-10-CM | POA: Diagnosis not present

## 2018-04-27 DIAGNOSIS — E785 Hyperlipidemia, unspecified: Secondary | ICD-10-CM | POA: Diagnosis not present

## 2018-04-27 DIAGNOSIS — I10 Essential (primary) hypertension: Secondary | ICD-10-CM | POA: Diagnosis not present

## 2018-07-27 DIAGNOSIS — E785 Hyperlipidemia, unspecified: Secondary | ICD-10-CM | POA: Diagnosis not present

## 2018-07-27 DIAGNOSIS — E119 Type 2 diabetes mellitus without complications: Secondary | ICD-10-CM | POA: Diagnosis not present

## 2018-07-27 DIAGNOSIS — I10 Essential (primary) hypertension: Secondary | ICD-10-CM | POA: Diagnosis not present

## 2018-10-26 DIAGNOSIS — Z1389 Encounter for screening for other disorder: Secondary | ICD-10-CM | POA: Diagnosis not present

## 2018-10-26 DIAGNOSIS — E039 Hypothyroidism, unspecified: Secondary | ICD-10-CM | POA: Diagnosis not present

## 2018-10-26 DIAGNOSIS — Z0001 Encounter for general adult medical examination with abnormal findings: Secondary | ICD-10-CM | POA: Diagnosis not present

## 2018-10-26 DIAGNOSIS — I10 Essential (primary) hypertension: Secondary | ICD-10-CM | POA: Diagnosis not present

## 2018-10-26 DIAGNOSIS — Z0000 Encounter for general adult medical examination without abnormal findings: Secondary | ICD-10-CM | POA: Diagnosis not present

## 2018-10-26 DIAGNOSIS — E785 Hyperlipidemia, unspecified: Secondary | ICD-10-CM | POA: Diagnosis not present

## 2018-10-26 DIAGNOSIS — E1165 Type 2 diabetes mellitus with hyperglycemia: Secondary | ICD-10-CM | POA: Diagnosis not present

## 2018-10-26 DIAGNOSIS — E119 Type 2 diabetes mellitus without complications: Secondary | ICD-10-CM | POA: Diagnosis not present

## 2018-11-02 DIAGNOSIS — J349 Unspecified disorder of nose and nasal sinuses: Secondary | ICD-10-CM | POA: Diagnosis not present

## 2018-11-02 DIAGNOSIS — H9313 Tinnitus, bilateral: Secondary | ICD-10-CM | POA: Diagnosis not present

## 2018-12-06 ENCOUNTER — Ambulatory Visit (INDEPENDENT_AMBULATORY_CARE_PROVIDER_SITE_OTHER): Payer: Medicare Other | Admitting: Otolaryngology

## 2018-12-06 DIAGNOSIS — H9313 Tinnitus, bilateral: Secondary | ICD-10-CM | POA: Diagnosis not present

## 2018-12-06 DIAGNOSIS — H903 Sensorineural hearing loss, bilateral: Secondary | ICD-10-CM

## 2019-01-26 DIAGNOSIS — E119 Type 2 diabetes mellitus without complications: Secondary | ICD-10-CM | POA: Diagnosis not present

## 2019-01-26 DIAGNOSIS — I10 Essential (primary) hypertension: Secondary | ICD-10-CM | POA: Diagnosis not present

## 2019-01-26 DIAGNOSIS — E785 Hyperlipidemia, unspecified: Secondary | ICD-10-CM | POA: Diagnosis not present

## 2019-03-07 ENCOUNTER — Other Ambulatory Visit: Payer: Self-pay

## 2019-03-07 NOTE — Patient Outreach (Signed)
[facility]) Care Management  03/07/2019  Lynn Mcclure Apr 19, 1934 [id number]   Medication Adherence call to Lynn Mcclure [facility] Identifiers Verify spoke with patient she is past due on Lisinopril 2.5 mg patient explain she is taking 1 tablet 4 times a day and only has a few patient has contacted  [facility] told her they are waiting for the doctor to approve a refill ,left a message at doctors office to send in a refill request on Lisinopril 2.5 mg. Lynn Mcclure is showing past due under [place].   [facility] Management Direct Dial [phone]  Fax [phone] [email or web address]

## 2019-06-02 DIAGNOSIS — E785 Hyperlipidemia, unspecified: Secondary | ICD-10-CM | POA: Diagnosis not present

## 2019-06-02 DIAGNOSIS — I10 Essential (primary) hypertension: Secondary | ICD-10-CM | POA: Diagnosis not present

## 2019-06-02 DIAGNOSIS — E119 Type 2 diabetes mellitus without complications: Secondary | ICD-10-CM | POA: Diagnosis not present

## 2019-07-02 DIAGNOSIS — E119 Type 2 diabetes mellitus without complications: Secondary | ICD-10-CM | POA: Diagnosis not present

## 2019-07-02 DIAGNOSIS — I10 Essential (primary) hypertension: Secondary | ICD-10-CM | POA: Diagnosis not present

## 2019-08-02 DIAGNOSIS — I10 Essential (primary) hypertension: Secondary | ICD-10-CM | POA: Diagnosis not present

## 2019-08-02 DIAGNOSIS — E785 Hyperlipidemia, unspecified: Secondary | ICD-10-CM | POA: Diagnosis not present

## 2019-09-01 DIAGNOSIS — I10 Essential (primary) hypertension: Secondary | ICD-10-CM | POA: Diagnosis not present

## 2019-09-01 DIAGNOSIS — E119 Type 2 diabetes mellitus without complications: Secondary | ICD-10-CM | POA: Diagnosis not present

## 2019-10-02 DIAGNOSIS — E119 Type 2 diabetes mellitus without complications: Secondary | ICD-10-CM | POA: Diagnosis not present

## 2019-10-02 DIAGNOSIS — I10 Essential (primary) hypertension: Secondary | ICD-10-CM | POA: Diagnosis not present

## 2019-11-02 DIAGNOSIS — E119 Type 2 diabetes mellitus without complications: Secondary | ICD-10-CM | POA: Diagnosis not present

## 2019-11-02 DIAGNOSIS — I10 Essential (primary) hypertension: Secondary | ICD-10-CM | POA: Diagnosis not present

## 2019-11-30 DIAGNOSIS — E119 Type 2 diabetes mellitus without complications: Secondary | ICD-10-CM | POA: Diagnosis not present

## 2019-11-30 DIAGNOSIS — I10 Essential (primary) hypertension: Secondary | ICD-10-CM | POA: Diagnosis not present

## 2019-12-29 DIAGNOSIS — I10 Essential (primary) hypertension: Secondary | ICD-10-CM | POA: Diagnosis not present

## 2019-12-29 DIAGNOSIS — E119 Type 2 diabetes mellitus without complications: Secondary | ICD-10-CM | POA: Diagnosis not present

## 2019-12-29 DIAGNOSIS — Z0001 Encounter for general adult medical examination with abnormal findings: Secondary | ICD-10-CM | POA: Diagnosis not present

## 2019-12-29 DIAGNOSIS — Z1389 Encounter for screening for other disorder: Secondary | ICD-10-CM | POA: Diagnosis not present

## 2019-12-29 DIAGNOSIS — E785 Hyperlipidemia, unspecified: Secondary | ICD-10-CM | POA: Diagnosis not present

## 2020-01-28 DIAGNOSIS — I10 Essential (primary) hypertension: Secondary | ICD-10-CM | POA: Diagnosis not present

## 2020-01-28 DIAGNOSIS — E119 Type 2 diabetes mellitus without complications: Secondary | ICD-10-CM | POA: Diagnosis not present

## 2020-02-28 DIAGNOSIS — I10 Essential (primary) hypertension: Secondary | ICD-10-CM | POA: Diagnosis not present

## 2020-02-28 DIAGNOSIS — E119 Type 2 diabetes mellitus without complications: Secondary | ICD-10-CM | POA: Diagnosis not present

## 2020-03-29 DIAGNOSIS — I10 Essential (primary) hypertension: Secondary | ICD-10-CM | POA: Diagnosis not present

## 2020-03-29 DIAGNOSIS — E119 Type 2 diabetes mellitus without complications: Secondary | ICD-10-CM | POA: Diagnosis not present

## 2020-04-27 ENCOUNTER — Other Ambulatory Visit: Payer: Self-pay

## 2020-04-27 ENCOUNTER — Encounter (HOSPITAL_COMMUNITY): Payer: Self-pay | Admitting: Emergency Medicine

## 2020-04-27 DIAGNOSIS — I10 Essential (primary) hypertension: Secondary | ICD-10-CM | POA: Diagnosis not present

## 2020-04-27 DIAGNOSIS — M810 Age-related osteoporosis without current pathological fracture: Secondary | ICD-10-CM | POA: Diagnosis not present

## 2020-04-27 DIAGNOSIS — Z79899 Other long term (current) drug therapy: Secondary | ICD-10-CM | POA: Insufficient documentation

## 2020-04-27 DIAGNOSIS — R2243 Localized swelling, mass and lump, lower limb, bilateral: Secondary | ICD-10-CM | POA: Diagnosis present

## 2020-04-27 DIAGNOSIS — E1122 Type 2 diabetes mellitus with diabetic chronic kidney disease: Secondary | ICD-10-CM | POA: Diagnosis not present

## 2020-04-27 DIAGNOSIS — N183 Chronic kidney disease, stage 3 unspecified: Secondary | ICD-10-CM | POA: Insufficient documentation

## 2020-04-27 DIAGNOSIS — J90 Pleural effusion, not elsewhere classified: Secondary | ICD-10-CM | POA: Diagnosis not present

## 2020-04-27 DIAGNOSIS — I129 Hypertensive chronic kidney disease with stage 1 through stage 4 chronic kidney disease, or unspecified chronic kidney disease: Secondary | ICD-10-CM | POA: Insufficient documentation

## 2020-04-27 DIAGNOSIS — R600 Localized edema: Secondary | ICD-10-CM | POA: Insufficient documentation

## 2020-04-27 DIAGNOSIS — M7989 Other specified soft tissue disorders: Secondary | ICD-10-CM | POA: Diagnosis not present

## 2020-04-27 NOTE — ED Triage Notes (Signed)
Pt c/o bilateral feet swelling for a few days and not injury noted.

## 2020-04-28 ENCOUNTER — Emergency Department (HOSPITAL_COMMUNITY): Payer: Medicare Other

## 2020-04-28 ENCOUNTER — Emergency Department (HOSPITAL_COMMUNITY)
Admission: EM | Admit: 2020-04-28 | Discharge: 2020-04-28 | Disposition: A | Discharge status: Home / Self Care | Payer: Medicare Other | Attending: Emergency Medicine | Admitting: Emergency Medicine

## 2020-04-28 DIAGNOSIS — R600 Localized edema: Secondary | ICD-10-CM

## 2020-04-28 DIAGNOSIS — M7989 Other specified soft tissue disorders: Secondary | ICD-10-CM | POA: Diagnosis not present

## 2020-04-28 DIAGNOSIS — M810 Age-related osteoporosis without current pathological fracture: Secondary | ICD-10-CM | POA: Diagnosis not present

## 2020-04-28 DIAGNOSIS — J90 Pleural effusion, not elsewhere classified: Secondary | ICD-10-CM | POA: Diagnosis not present

## 2020-04-28 LAB — CBC WITH DIFFERENTIAL/PLATELET
Abs Immature Granulocytes: 0.01 10*3/uL (ref 0.00–0.07)
Basophils Absolute: 0 10*3/uL (ref 0.0–0.1)
Basophils Relative: 1 %
Eosinophils Absolute: 0.3 10*3/uL (ref 0.0–0.5)
Eosinophils Relative: 4 %
HCT: 33.9 % — ABNORMAL LOW (ref 36.0–46.0)
Hemoglobin: 10.3 g/dL — ABNORMAL LOW (ref 12.0–15.0)
Immature Granulocytes: 0 %
Lymphocytes Relative: 34 %
Lymphs Abs: 2.4 10*3/uL (ref 0.7–4.0)
MCH: 28.9 pg (ref 26.0–34.0)
MCHC: 30.4 g/dL (ref 30.0–36.0)
MCV: 95 fL (ref 80.0–100.0)
Monocytes Absolute: 0.7 10*3/uL (ref 0.1–1.0)
Monocytes Relative: 10 %
Neutro Abs: 3.5 10*3/uL (ref 1.7–7.7)
Neutrophils Relative %: 51 %
Platelets: 278 10*3/uL (ref 150–400)
RBC: 3.57 MIL/uL — ABNORMAL LOW (ref 3.87–5.11)
RDW: 14.3 % (ref 11.5–15.5)
WBC: 6.8 10*3/uL (ref 4.0–10.5)
nRBC: 0 % (ref 0.0–0.2)

## 2020-04-28 LAB — COMPREHENSIVE METABOLIC PANEL
ALT: 10 U/L (ref 0–44)
AST: 21 U/L (ref 15–41)
Albumin: 4.1 g/dL (ref 3.5–5.0)
Alkaline Phosphatase: 62 U/L (ref 38–126)
Anion gap: 4 — ABNORMAL LOW (ref 5–15)
BUN: 21 mg/dL (ref 8–23)
CO2: 21 mmol/L — ABNORMAL LOW (ref 22–32)
Calcium: 8.4 mg/dL — ABNORMAL LOW (ref 8.9–10.3)
Chloride: 112 mmol/L — ABNORMAL HIGH (ref 98–111)
Creatinine, Ser: 1.79 mg/dL — ABNORMAL HIGH (ref 0.44–1.00)
GFR calc Af Amer: 30 mL/min — ABNORMAL LOW (ref >60)
GFR calc non Af Amer: 26 mL/min — ABNORMAL LOW (ref >60)
Glucose, Bld: 124 mg/dL — ABNORMAL HIGH (ref 70–99)
Potassium: 4.7 mmol/L (ref 3.5–5.1)
Sodium: 137 mmol/L (ref 135–145)
Total Bilirubin: 0.6 mg/dL (ref 0.3–1.2)
Total Protein: 7.2 g/dL (ref 6.5–8.1)

## 2020-04-28 LAB — BRAIN NATRIURETIC PEPTIDE: B Natriuretic Peptide: 53 pg/mL (ref 0.0–100.0)

## 2020-04-28 LAB — TROPONIN I (HIGH SENSITIVITY): Troponin I (High Sensitivity): 12 ng/L (ref <18)

## 2020-04-28 MED ORDER — FUROSEMIDE 20 MG PO TABS
20.0000 mg | ORAL_TABLET | Freq: Every day | ORAL | 0 refills | Status: DC | PRN
Start: 2020-04-28 — End: 2021-02-15

## 2020-04-28 NOTE — ED Provider Notes (Signed)
[facility] EMERGENCY DEPARTMENT Provider Note   CSN: [id number] Arrival date & time: 04/27/20  2114     History Chief Complaint  Patient presents with  . Foot Swelling    Lynn Mcclure is a 86 y.o. female.   Foot Pain This is a new problem. The problem has been gradually worsening. Pertinent negatives include no chest pain and no headaches. Nothing aggravates the symptoms. Nothing relieves the symptoms. She has tried nothing for the symptoms. The treatment provided no relief.       Past Medical History:  Diagnosis Date  . CKD (chronic kidney disease), stage III   . Diabetes mellitus   . Hyperlipidemia   . Hypertension   . Normocytic anemia   . Osteoarthritis   . Seborrheic keratosis     Patient Active Problem List   Diagnosis Date Noted  . ANGINA, STABLE 11/29/2008  . ANEMIA, NORMOCYTIC 01/22/2007  . DIABETES MELLITUS, TYPE II, CONTROLLED 10/30/2006  . HYPERLIPIDEMIA 08/20/2006  . HYPERTENSION 08/20/2006  . KIDNEY DISEASE, CHRONIC, STAGE III 08/20/2006  . SEBORRHEIC KERATOSIS 08/20/2006  . OSTEOARTHRITIS 08/20/2006    Past Surgical History:  Procedure Laterality Date  . COLONOSCOPY  2014     OB History   No obstetric history on file.     Family History  Problem Relation Age of Onset  . Heart attack Mother   . Coronary artery disease Brother   . Anemia Sister   . Diabetes Sister     Social History   Tobacco Use  . Smoking status: Never Smoker  . Smokeless tobacco: Never Used  Substance Use Topics  . Alcohol use: No  . Drug use: No    Home Medications Prior to Admission medications   Medication Sig Start Date End Date Taking? Authorizing Provider  acetaminophen (TYLENOL) 500 MG tablet Take 500 mg by mouth every 6 (six) hours as needed for mild pain.    [provider]  atorvastatin (LIPITOR) 20 MG tablet Take 20 mg by mouth daily.    [provider]  furosemide (LASIX) 20 MG tablet Take 1 tablet (20 mg total) by  mouth daily as needed for edema. 04/28/20   Carlton Buskey, Corene Cornea, MD  glyBURIDE (DIABETA) 5 MG tablet Take 5 mg by mouth daily with breakfast.    [provider]  Iron-Vitamins (S.S.S. TONIC PO) Take 5 mLs by mouth daily.    [provider]  isosorbide mononitrate (ISMO,MONOKET) 10 MG tablet Take 10 mg by mouth 2 (two) times daily.      [provider]  lisinopril (PRINIVIL,ZESTRIL) 10 MG tablet Take 2 tablets by mouth daily.  12/19/14   [provider]  metoprolol tartrate (LOPRESSOR) 25 MG tablet Take 25 mg by mouth 2 (two) times daily.      [provider]  potassium chloride (K-DUR) 10 MEQ tablet Take 1 tablet by mouth daily. 12/19/14   [provider]  fluvastatin XL (LESCOL XL) 80 MG 24 hr tablet Take 80 mg by mouth daily.    12/16/11  [provider]    Allergies    Atenolol, Etodolac, Furosemide, Red dye, Rosiglitazone-glimepiride, and Asa [aspirin]  Review of Systems   Review of Systems  Cardiovascular: Negative for chest pain.  Neurological: Negative for headaches.  All other systems reviewed and are negative.   Physical Exam Updated Vital Signs BP (!) 142/62   Pulse 62   Temp 97.8 F (36.6 C) (Oral)   Resp 16   Ht 5\' 7"  (  1.702 m)   SpO2 99%   BMI 30.23 kg/m   Physical Exam Vitals and nursing note reviewed.  Constitutional:      Appearance: She is well-developed.  HENT:     Head: Normocephalic and atraumatic.     Nose: No congestion or rhinorrhea.     Mouth/Throat:     Mouth: Mucous membranes are moist.     Pharynx: Oropharynx is clear.  Eyes:     Pupils: Pupils are equal, round, and reactive to light.  Cardiovascular:     Rate and Rhythm: Normal rate and regular rhythm.  Pulmonary:     Effort: No respiratory distress.     Breath sounds: No stridor.  Abdominal:     General: There is no distension.  Musculoskeletal:        General: Tenderness present. Normal range of motion.     Cervical back: Normal  range of motion.     Right lower leg: Edema present.     Left lower leg: Edema present.  Skin:    General: Skin is warm and dry.  Neurological:     General: No focal deficit present.     Mental Status: She is alert.     Cranial Nerves: No cranial nerve deficit.     ED Results / Procedures / Treatments   Labs (all labs ordered are listed, but only abnormal results are displayed) Labs Reviewed  CBC WITH DIFFERENTIAL/PLATELET - Abnormal; Notable for the following components:      Result Value   RBC 3.57 (*)    Hemoglobin 10.3 (*)    HCT 33.9 (*)    All other components within normal limits  COMPREHENSIVE METABOLIC PANEL - Abnormal; Notable for the following components:   Chloride 112 (*)    CO2 21 (*)    Glucose, Bld 124 (*)    Creatinine, Ser 1.79 (*)    Calcium 8.4 (*)    GFR calc non Af Amer 26 (*)    GFR calc Af Amer 30 (*)    Anion gap 4 (*)    All other components within normal limits  BRAIN NATRIURETIC PEPTIDE  TROPONIN I (HIGH SENSITIVITY)    EKG EKG Interpretation  Date/Time:  Saturday April 28 2020 02:20:38 EDT Ventricular Rate:  61 PR Interval:    QRS Duration: 109 QT Interval:  423 QTC Calculation: 423 R Axis:   50 Text Interpretation: Atrial fibrillation Repol abnrm suggests ischemia, anterolateral too much artifact to read appropriately Confirmed by Merrily Pew [id number]) on 04/28/2020 3:28:43 AM   Radiology DG Chest Portable 1 View  Result Date: 04/28/2020 CLINICAL DATA:  Lower extremities lying EXAM: PORTABLE CHEST 1 VIEW COMPARISON:  None. FINDINGS: Normal heart size and pulmonary vascularity. Lungs are clear. No pleural effusions. No pneumothorax. Mediastinal contours appear intact. Degenerative changes in the spine and shoulders. IMPRESSION: No active disease. Electronically Signed   By: Lucienne Capers M.D.   On: 04/28/2020 01:40   DG Foot Complete Right  Result Date: 04/28/2020 CLINICAL DATA:  Lower extremity swelling starting in the right foot  and ankle EXAM: RIGHT FOOT COMPLETE - 3+ VIEW COMPARISON:  None. FINDINGS: Diffuse soft tissue edema. Diffuse bone demineralization. No acute fracture or dislocation. No focal bone erosion. No radiopaque soft tissue foreign bodies or gas collections. IMPRESSION: Diffuse soft tissue edema. No acute bony abnormalities. Electronically Signed   By: Lucienne Capers M.D.   On: 04/28/2020 01:42    Procedures Procedures (including critical care time)  Medications Ordered  in ED Medications - No data to display  ED Course  I have reviewed the triage vital signs and the nursing notes.  Pertinent labs & imaging results that were available during my care of the patient were reviewed by me and considered in my medical decision making (see chart for details).    MDM Rules/Calculators/A&P                          Edema without overt evidence of HF or or renal failure. Will trial lasix for a few days with pcp follow up for further management of same.   Final Clinical Impression(s) / ED Diagnoses Final diagnoses:  Pedal edema    Rx / DC Orders ED Discharge Orders         Ordered    furosemide (LASIX) 20 MG tablet  Daily PRN     Discontinue  Reprint     04/28/20 0456           Yukiko Minnich, Corene Cornea, MD 04/28/20 [phone]

## 2020-05-01 ENCOUNTER — Other Ambulatory Visit: Payer: Self-pay | Admitting: Internal Medicine

## 2020-05-01 DIAGNOSIS — N183 Chronic kidney disease, stage 3 unspecified: Secondary | ICD-10-CM | POA: Diagnosis not present

## 2020-05-01 DIAGNOSIS — R8290 Unspecified abnormal findings in urine: Secondary | ICD-10-CM | POA: Diagnosis not present

## 2020-05-01 DIAGNOSIS — E119 Type 2 diabetes mellitus without complications: Secondary | ICD-10-CM | POA: Diagnosis not present

## 2020-05-01 DIAGNOSIS — E1165 Type 2 diabetes mellitus with hyperglycemia: Secondary | ICD-10-CM | POA: Diagnosis not present

## 2020-05-01 DIAGNOSIS — E785 Hyperlipidemia, unspecified: Secondary | ICD-10-CM | POA: Diagnosis not present

## 2020-05-01 DIAGNOSIS — I10 Essential (primary) hypertension: Secondary | ICD-10-CM | POA: Diagnosis not present

## 2020-05-07 ENCOUNTER — Ambulatory Visit (HOSPITAL_COMMUNITY)
Admission: RE | Admit: 2020-05-07 | Discharge: 2020-05-07 | Disposition: A | Discharge status: Home / Self Care | Payer: Medicare Other | Source: Ambulatory Visit | Attending: Internal Medicine | Admitting: Internal Medicine

## 2020-05-07 ENCOUNTER — Other Ambulatory Visit: Payer: Self-pay

## 2020-05-07 DIAGNOSIS — N183 Chronic kidney disease, stage 3 unspecified: Secondary | ICD-10-CM

## 2020-05-07 DIAGNOSIS — N281 Cyst of kidney, acquired: Secondary | ICD-10-CM | POA: Diagnosis not present

## 2020-06-01 DIAGNOSIS — E785 Hyperlipidemia, unspecified: Secondary | ICD-10-CM | POA: Diagnosis not present

## 2020-06-01 DIAGNOSIS — I10 Essential (primary) hypertension: Secondary | ICD-10-CM | POA: Diagnosis not present

## 2020-07-05 DIAGNOSIS — D638 Anemia in other chronic diseases classified elsewhere: Secondary | ICD-10-CM | POA: Diagnosis not present

## 2020-07-05 DIAGNOSIS — Z79899 Other long term (current) drug therapy: Secondary | ICD-10-CM | POA: Diagnosis not present

## 2020-07-05 DIAGNOSIS — N170 Acute kidney failure with tubular necrosis: Secondary | ICD-10-CM | POA: Diagnosis not present

## 2020-07-05 DIAGNOSIS — I129 Hypertensive chronic kidney disease with stage 1 through stage 4 chronic kidney disease, or unspecified chronic kidney disease: Secondary | ICD-10-CM | POA: Diagnosis not present

## 2020-07-05 DIAGNOSIS — E872 Acidosis: Secondary | ICD-10-CM | POA: Diagnosis not present

## 2020-07-10 DIAGNOSIS — E785 Hyperlipidemia, unspecified: Secondary | ICD-10-CM | POA: Diagnosis not present

## 2020-07-10 DIAGNOSIS — E119 Type 2 diabetes mellitus without complications: Secondary | ICD-10-CM | POA: Diagnosis not present

## 2020-07-10 DIAGNOSIS — N184 Chronic kidney disease, stage 4 (severe): Secondary | ICD-10-CM | POA: Diagnosis not present

## 2020-07-10 DIAGNOSIS — I10 Essential (primary) hypertension: Secondary | ICD-10-CM | POA: Diagnosis not present

## 2020-07-16 ENCOUNTER — Other Ambulatory Visit: Payer: Self-pay | Admitting: Nephrology

## 2020-07-16 ENCOUNTER — Other Ambulatory Visit (HOSPITAL_COMMUNITY): Payer: Self-pay | Admitting: Nephrology

## 2020-07-16 DIAGNOSIS — I129 Hypertensive chronic kidney disease with stage 1 through stage 4 chronic kidney disease, or unspecified chronic kidney disease: Secondary | ICD-10-CM

## 2020-07-16 DIAGNOSIS — D638 Anemia in other chronic diseases classified elsewhere: Secondary | ICD-10-CM

## 2020-07-16 DIAGNOSIS — Z79899 Other long term (current) drug therapy: Secondary | ICD-10-CM

## 2020-07-16 DIAGNOSIS — N281 Cyst of kidney, acquired: Secondary | ICD-10-CM

## 2020-07-16 DIAGNOSIS — N184 Chronic kidney disease, stage 4 (severe): Secondary | ICD-10-CM

## 2020-07-16 DIAGNOSIS — E872 Acidosis, unspecified: Secondary | ICD-10-CM

## 2020-07-16 DIAGNOSIS — N170 Acute kidney failure with tubular necrosis: Secondary | ICD-10-CM

## 2020-07-20 ENCOUNTER — Other Ambulatory Visit (HOSPITAL_COMMUNITY): Payer: Self-pay | Admitting: Nephrology

## 2020-07-20 DIAGNOSIS — I129 Hypertensive chronic kidney disease with stage 1 through stage 4 chronic kidney disease, or unspecified chronic kidney disease: Secondary | ICD-10-CM

## 2020-07-26 ENCOUNTER — Encounter (HOSPITAL_COMMUNITY): Payer: Self-pay

## 2020-07-26 ENCOUNTER — Ambulatory Visit (HOSPITAL_COMMUNITY): Admission: RE | Admit: 2020-07-26 | Payer: Medicare Other | Source: Ambulatory Visit

## 2020-07-26 DIAGNOSIS — E872 Acidosis: Secondary | ICD-10-CM | POA: Diagnosis not present

## 2020-07-26 DIAGNOSIS — N281 Cyst of kidney, acquired: Secondary | ICD-10-CM | POA: Diagnosis not present

## 2020-07-26 DIAGNOSIS — Z79899 Other long term (current) drug therapy: Secondary | ICD-10-CM | POA: Diagnosis not present

## 2020-07-26 DIAGNOSIS — I129 Hypertensive chronic kidney disease with stage 1 through stage 4 chronic kidney disease, or unspecified chronic kidney disease: Secondary | ICD-10-CM | POA: Diagnosis not present

## 2020-07-26 DIAGNOSIS — N184 Chronic kidney disease, stage 4 (severe): Secondary | ICD-10-CM | POA: Diagnosis not present

## 2020-07-26 DIAGNOSIS — N170 Acute kidney failure with tubular necrosis: Secondary | ICD-10-CM | POA: Diagnosis not present

## 2020-07-27 ENCOUNTER — Other Ambulatory Visit: Payer: Self-pay

## 2020-07-27 ENCOUNTER — Ambulatory Visit (HOSPITAL_COMMUNITY)
Admission: RE | Admit: 2020-07-27 | Discharge: 2020-07-27 | Disposition: A | Discharge status: Home / Self Care | Payer: Medicare Other | Source: Ambulatory Visit | Attending: Nephrology | Admitting: Nephrology

## 2020-07-27 DIAGNOSIS — I129 Hypertensive chronic kidney disease with stage 1 through stage 4 chronic kidney disease, or unspecified chronic kidney disease: Secondary | ICD-10-CM | POA: Diagnosis not present

## 2020-07-27 LAB — ECHOCARDIOGRAM COMPLETE
AR max vel: 2.47 cm2
AV Area VTI: 2.38 cm2
AV Area mean vel: 2.39 cm2
AV Mean grad: 3.3 mmHg
AV Peak grad: 6.7 mmHg
Ao pk vel: 1.3 m/s
Area-P 1/2: 2.93 cm2
S' Lateral: 2.42 cm

## 2020-07-27 NOTE — Progress Notes (Signed)
*  PRELIMINARY RESULTS* Echocardiogram 2D Echocardiogram has been performed.  Lynn Mcclure 07/27/2020, 10:09 AM

## 2020-08-08 ENCOUNTER — Ambulatory Visit (HOSPITAL_COMMUNITY)
Admission: RE | Admit: 2020-08-08 | Discharge: 2020-08-08 | Disposition: A | Discharge status: Home / Self Care | Payer: Medicare Other | Source: Ambulatory Visit | Attending: Nephrology | Admitting: Nephrology

## 2020-08-08 ENCOUNTER — Other Ambulatory Visit: Payer: Self-pay

## 2020-08-08 DIAGNOSIS — I129 Hypertensive chronic kidney disease with stage 1 through stage 4 chronic kidney disease, or unspecified chronic kidney disease: Secondary | ICD-10-CM

## 2020-08-08 DIAGNOSIS — E211 Secondary hyperparathyroidism, not elsewhere classified: Secondary | ICD-10-CM | POA: Diagnosis not present

## 2020-08-08 DIAGNOSIS — I5032 Chronic diastolic (congestive) heart failure: Secondary | ICD-10-CM | POA: Diagnosis not present

## 2020-08-08 DIAGNOSIS — E872 Acidosis, unspecified: Secondary | ICD-10-CM

## 2020-08-08 DIAGNOSIS — N184 Chronic kidney disease, stage 4 (severe): Secondary | ICD-10-CM | POA: Diagnosis not present

## 2020-08-08 DIAGNOSIS — N189 Chronic kidney disease, unspecified: Secondary | ICD-10-CM | POA: Diagnosis not present

## 2020-08-08 DIAGNOSIS — Z79899 Other long term (current) drug therapy: Secondary | ICD-10-CM | POA: Diagnosis not present

## 2020-08-08 DIAGNOSIS — D638 Anemia in other chronic diseases classified elsewhere: Secondary | ICD-10-CM

## 2020-08-08 DIAGNOSIS — N170 Acute kidney failure with tubular necrosis: Secondary | ICD-10-CM

## 2020-08-08 DIAGNOSIS — K8020 Calculus of gallbladder without cholecystitis without obstruction: Secondary | ICD-10-CM | POA: Diagnosis not present

## 2020-08-08 DIAGNOSIS — N281 Cyst of kidney, acquired: Secondary | ICD-10-CM | POA: Diagnosis not present

## 2020-08-10 DIAGNOSIS — I10 Essential (primary) hypertension: Secondary | ICD-10-CM | POA: Diagnosis not present

## 2020-08-10 DIAGNOSIS — E119 Type 2 diabetes mellitus without complications: Secondary | ICD-10-CM | POA: Diagnosis not present

## 2020-09-09 DIAGNOSIS — I10 Essential (primary) hypertension: Secondary | ICD-10-CM | POA: Diagnosis not present

## 2020-09-09 DIAGNOSIS — E119 Type 2 diabetes mellitus without complications: Secondary | ICD-10-CM | POA: Diagnosis not present

## 2021-01-24 DIAGNOSIS — I10 Essential (primary) hypertension: Secondary | ICD-10-CM | POA: Diagnosis not present

## 2021-01-24 DIAGNOSIS — E119 Type 2 diabetes mellitus without complications: Secondary | ICD-10-CM | POA: Diagnosis not present

## 2021-02-04 ENCOUNTER — Ambulatory Visit
Admission: EM | Admit: 2021-02-04 | Discharge: 2021-02-04 | Disposition: A | Discharge status: Home / Self Care | Payer: Medicare Other

## 2021-02-04 ENCOUNTER — Other Ambulatory Visit: Payer: Self-pay

## 2021-02-04 DIAGNOSIS — R600 Localized edema: Secondary | ICD-10-CM

## 2021-02-04 DIAGNOSIS — I509 Heart failure, unspecified: Secondary | ICD-10-CM

## 2021-02-04 NOTE — ED Provider Notes (Signed)
[facility]    CSN: [id number] Arrival date & time: 02/04/21  1648      History   Chief Complaint No chief complaint on file.   HPI Lynn Mcclure is a 87 y.o. female.   HPI  Patient with a history of CKD stage 4, CHF (followed by PCP only) , presents today with worsening bilateral pitting edema. This is a chronic problem. She is accompanied by daughter who reports patient eats a food high on sodium. She is prescribed Lasix and take as prescribed. She has not experienced unintentional weigh gain, SOB, wheezing..  Past Medical History:  Diagnosis Date  . CKD (chronic kidney disease), stage III ([place])   . Diabetes mellitus   . Hyperlipidemia   . Hypertension   . Normocytic anemia   . Osteoarthritis   . Seborrheic keratosis     Patient Active Problem List   Diagnosis Date Noted  . ANGINA, STABLE 11/29/2008  . ANEMIA, NORMOCYTIC 01/22/2007  . DIABETES MELLITUS, TYPE II, CONTROLLED 10/30/2006  . HYPERLIPIDEMIA 08/20/2006  . HYPERTENSION 08/20/2006  . KIDNEY DISEASE, CHRONIC, STAGE III 08/20/2006  . SEBORRHEIC KERATOSIS 08/20/2006  . OSTEOARTHRITIS 08/20/2006    Past Surgical History:  Procedure Laterality Date  . COLONOSCOPY  2014    OB History   No obstetric history on file.      Home Medications    Prior to Admission medications   Medication Sig Start Date End Date Taking? Authorizing Provider  paricalcitol (ZEMPLAR) 1 MCG capsule Take 1 mcg by mouth daily.   Yes [provider]  acetaminophen (TYLENOL) 500 MG tablet Take 500 mg by mouth every 6 (six) hours as needed for mild pain.    [provider]  atorvastatin (LIPITOR) 20 MG tablet Take 20 mg by mouth daily.    [provider]  furosemide (LASIX) 20 MG tablet Take 1 tablet (20 mg total) by mouth daily as needed for edema. 04/28/20   Mesner, Corene Cornea, MD  glyBURIDE (DIABETA) 5 MG tablet Take 5 mg by mouth daily with breakfast.    [provider]   Iron-Vitamins (S.S.S. TONIC PO) Take 5 mLs by mouth daily.    [provider]  isosorbide mononitrate (ISMO,MONOKET) 10 MG tablet Take 10 mg by mouth 2 (two) times daily.      [provider]  lisinopril (PRINIVIL,ZESTRIL) 10 MG tablet Take 2 tablets by mouth daily.  12/19/14   [provider]  metoprolol tartrate (LOPRESSOR) 25 MG tablet Take 25 mg by mouth 2 (two) times daily.      [provider]  potassium chloride (K-DUR) 10 MEQ tablet Take 1 tablet by mouth daily. 12/19/14   [provider]  fluvastatin XL (LESCOL XL) 80 MG 24 hr tablet Take 80 mg by mouth daily.    12/16/11  [provider]    Family History Family History  Problem Relation Age of Onset  . Heart attack Mother   . Coronary artery disease Brother   . Anemia Sister   . Diabetes Sister     Social History Social History   Tobacco Use  . Smoking status: Never Smoker  . Smokeless tobacco: Never Used  Substance Use Topics  . Alcohol use: No  . Drug use: No     Allergies   Atenolol, Etodolac, Furosemide, Red dye, Rosiglitazone-glimepiride, and Asa [aspirin]   Review of Systems Review of Systems  Pertinent negatives listed in HPI  Physical Exam Triage Vital Signs ED Triage Vitals  Enc Vitals Group     BP 02/04/21 1808 118/77     Pulse Rate 02/04/21 1808 64     Resp 02/04/21 1808 18     Temp 02/04/21 1808 (!) 97.5 F (36.4 C)     Temp src --      SpO2 02/04/21 1808 95 %     Weight --      Height --      Head Circumference --      Peak Flow --      Pain Score 02/04/21 1804 4     Pain Loc --      Pain Edu? --      Excl. in [place]? --    No data found.  Updated Vital Signs BP 118/77   Pulse 64   Temp (!) 97.5 F (36.4 C)   Resp 18   SpO2 95%   Visual Acuity Right Eye Distance:   Left Eye Distance:   Bilateral Distance:    Right Eye Near:   Left Eye Near:    Bilateral Near:     Physical Exam Constitutional:      Appearance: She is  underweight.     Comments: Chronically ill appearing   Cardiovascular:     Rate and Rhythm: Normal rate and regular rhythm.     Pulses:          Radial pulses are 2+ on the right side and 2+ on the left side.  Musculoskeletal:     Right lower leg: 3+ Pitting Edema present.     Left lower leg: 3+ Pitting Edema present.  Skin:    General: Skin is warm.     Capillary Refill: Capillary refill takes less than 2 seconds.  Psychiatric:        Attention and Perception: Attention normal.        Mood and Affect: Mood and affect normal.        Speech: Speech normal.      UC Treatments / Results  Labs (all labs ordered are listed, but only abnormal results are displayed) Labs Reviewed - No data to display  EKG   Radiology No results found.  Procedures Procedures (including critical care time)  Medications Ordered in UC Medications - No data to display  Initial Impression / Assessment and Plan / UC Course  I have reviewed the triage vital signs and the nursing notes.  Pertinent labs & imaging results that were available during my care of the patient were reviewed by me and considered in my medical decision making (see chart for details).    Patient w/ history of CHF and CKD 4 presents today with worsening BLUE pitting edema. Concern today symptoms may be related to  CHF. Weight checked today 147. 5 lbs consistent with prior weight check on 01/09/21, weight was 150.  Advised to restrict sodium rich food and beverages,elevate legs, ER if symptoms worsen. Urgent cardiology referral placed.   Final Clinical Impressions(s) / UC Diagnoses   Final diagnoses:  Bilateral leg edema  Chronic congestive heart failure, unspecified heart failure type [facility])     Discharge Instructions     Continue Lasix. Reduce sodium (stop potato chips and soda's) If shortness of breath, chest pain, or severe weakness develops go immediately to the ER.    ED Prescriptions    None     PDMP not  reviewed this encounter.   Scot Jun, [place] 02/05/21 [phone]

## 2021-02-04 NOTE — Discharge Instructions (Addendum)
Continue Lasix. Reduce sodium (stop potato chips and soda's) If shortness of breath, chest pain, or severe weakness develops go immediately to the ER.

## 2021-02-04 NOTE — ED Triage Notes (Signed)
Pt presents with bilateral leg edema that has worsened in past week, unable to get in with kidney dr this week

## 2021-02-08 ENCOUNTER — Ambulatory Visit (INDEPENDENT_AMBULATORY_CARE_PROVIDER_SITE_OTHER): Payer: Medicare Other | Admitting: Internal Medicine

## 2021-02-08 ENCOUNTER — Other Ambulatory Visit: Payer: Self-pay

## 2021-02-08 ENCOUNTER — Encounter: Payer: Self-pay | Admitting: Internal Medicine

## 2021-02-08 VITALS — BP 106/62 | HR 72 | Ht 67.0 in | Wt 146.0 lb

## 2021-02-08 DIAGNOSIS — R600 Localized edema: Secondary | ICD-10-CM | POA: Diagnosis not present

## 2021-02-08 NOTE — Patient Instructions (Signed)
Medication Instructions:  No changes *If you need a refill on your cardiac medications before your next appointment, please call your pharmacy*   Lab Work: Today: cbc, cmet, tsh, bnp  If you have labs (blood work) drawn today and your tests are completely normal, you will receive your results only by: Marland Kitchen MyChart Message (if you have MyChart) OR . A paper copy in the mail If you have any lab test that is abnormal or we need to change your treatment, we will call you to review the results.   Testing/Procedures:  ECHO IN [facility]) Your physician has requested that you have an echocardiogram. Echocardiography is a painless test that uses sound waves to create images of your heart. It provides your doctor with information about the size and shape of your heart and how well your heart's chambers and valves are working. This procedure takes approximately one hour. There are no restrictions for this procedure.   Follow-Up: Follow up with your physician will depend on test results.   Other Instructions

## 2021-02-08 NOTE — Progress Notes (Signed)
Cardiology Office Note   Date:  02/08/2021   ID:  Lynn Mcclure, DOB 04/01/1934, MRN [id number]  PCP:  Rosita Fire, MD  Cardiologist:   Dorris Carnes, MD    Patient referred for evaluation of LE edema     History of Present Illness: Lynn Mcclure is a 87 y.o. female with a history of CKD stage 4,, DM, HL HTN and anemia   She wa seen in ED for evaluation worsening LE edema  Per review of ED record the pt eats high sodium food  Told to restrict and keep on lasix      The pt denies a hx of CP   No palpitations   No syncope    Edema has persisted since seen in ED a few days ago     She says overall the edema is worse over the past 2 wks only         Current Meds  Medication Sig  . acetaminophen (TYLENOL) 500 MG tablet Take 500 mg by mouth every 6 (six) hours as needed for mild pain.  Marland Kitchen atorvastatin (LIPITOR) 20 MG tablet Take 20 mg by mouth daily.  . furosemide (LASIX) 20 MG tablet Take 1 tablet (20 mg total) by mouth daily as needed for edema.  Marland Kitchen glyBURIDE (DIABETA) 5 MG tablet Take 5 mg by mouth daily with breakfast.  . Iron-Vitamins (S.S.S. TONIC PO) Take 5 mLs by mouth daily.  . isosorbide mononitrate (ISMO,MONOKET) 10 MG tablet Take 10 mg by mouth 2 (two) times daily.  . metoprolol tartrate (LOPRESSOR) 25 MG tablet Take 25 mg by mouth 2 (two) times daily.  . paricalcitol (ZEMPLAR) 1 MCG capsule Take 1 mcg by mouth daily.  . potassium chloride (K-DUR) 10 MEQ tablet Take 1 tablet by mouth daily.  . [DISCONTINUED] fluvastatin XL (LESCOL XL) 80 MG 24 hr tablet Take 80 mg by mouth daily.     Allergies:   Atenolol, Etodolac, Furosemide, Red dye, Rosiglitazone-glimepiride, and Asa [aspirin]   Past Medical History:  Diagnosis Date  . CKD (chronic kidney disease), stage III ([place])   . Diabetes mellitus   . Hyperlipidemia   . Hypertension   . Normocytic anemia   . Osteoarthritis   . Seborrheic keratosis     Past Surgical History:  Procedure Laterality Date  .  COLONOSCOPY  2014     Social History:  The patient  reports that she has never smoked. She has never used smokeless tobacco. She reports that she does not drink alcohol and does not use drugs.   Family History:  The patient's family history includes Anemia in her sister; Coronary artery disease in her brother; Diabetes in her sister; Heart attack in her mother.    ROS:  Please see the history of present illness. All other systems are reviewed and  Negative to the above problem except as noted.    PHYSICAL EXAM: VS:  BP 106/62   Pulse 72   Ht 5\' 7"  (1.702 m)   Wt 146 lb (66.2 kg)   BMI 22.87 kg/m   GEN: Well nourished, well developed, in no acute distress  HEENT: normal  Neck: JVP is not increased  No , carotid bruits  Cardiac: RRR; no murmurs,  3+LE edema  Respiratory:  clear to auscultation bilaterally  GI: soft, nontender, nondistended, + BS  No hepatomegaly  MS: no deformity Moving all extremities   Skin: warm and dry, no rash Neuro:  Moving all extrem  No focal  defect  EKG:  EKG is ordered today.  Echo:   07/2020 1. Left ventricular ejection fraction, by estimation, is 60 to 65%. The left ventricle has normal function. The left ventricle has no regional wall motion abnormalities. Left ventricular diastolic parameters are consistent with Grade I diastolic dysfunction (impaired relaxation). 2. Right ventricular systolic function is normal. The right ventricular size is normal. There is normal pulmonary artery systolic pressure. 3. The mitral valve is normal in structure. Trivial mitral valve regurgitation. No evidence of mitral stenosis. 4. The aortic valve is tricuspid. Aortic valve regurgitation is not visualized. No aortic stenosis is present. 5. The inferior vena cava is normal in size with greater than 50% respiratory variability, suggesting right atrial pressure of 3 mmHg.  Lipid Panel    Component Value Date/Time   CHOL 159 08/11/2008 0440   TRIG 138  08/11/2008 0440   HDL 33 (L) 08/11/2008 0440   CHOLHDL 4.8 Ratio 08/11/2008 0440   VLDL 28 08/11/2008 0440   LDLCALC 98 08/11/2008 0440      Wt Readings from Last 3 Encounters:  02/08/21 146 lb (66.2 kg)  10/09/15 193 lb (87.5 kg)  12/20/14 197 lb (89.4 kg)      ASSESSMENT AND PLAN:  1  LE edema   The patient says her LE edema is fairly recent in worsening    Per ED report she admitted to eating salty foods  Discussed salt restriction Would get labs today to guide medical Rx   I would als orecomm setting up for an echo to reassess LVEF and RVEF  Will get CBC, CMET, TSH and BNP  2  CKD  Check BMET  3   HL  Keep on atorvastatin   Current medicines are reviewed at length with the patient today.  The patient does not have concerns regarding medicines.  Signed, Dorris Carnes, MD  02/08/2021 3:35 PM    [facility], [place], [place]  [address] Phone: [phone]; Fax: [phone]

## 2021-02-09 LAB — CBC
Hematocrit: 31 % — ABNORMAL LOW (ref 34.0–46.6)
Hemoglobin: 9.7 g/dL — ABNORMAL LOW (ref 11.1–15.9)
MCH: 27.9 pg (ref 26.6–33.0)
MCHC: 31.3 g/dL — ABNORMAL LOW (ref 31.5–35.7)
MCV: 89 fL (ref 79–97)
Platelets: 332 10*3/uL (ref 150–450)
RBC: 3.48 x10E6/uL — ABNORMAL LOW (ref 3.77–5.28)
RDW: 12.9 % (ref 11.7–15.4)
WBC: 5.9 10*3/uL (ref 3.4–10.8)

## 2021-02-09 LAB — COMPREHENSIVE METABOLIC PANEL
ALT: 14 IU/L (ref 0–32)
AST: 19 IU/L (ref 0–40)
Albumin/Globulin Ratio: 1.2 (ref 1.2–2.2)
Albumin: 3.3 g/dL — ABNORMAL LOW (ref 3.6–4.6)
Alkaline Phosphatase: 87 IU/L (ref 44–121)
BUN/Creatinine Ratio: 11 — ABNORMAL LOW (ref 12–28)
BUN: 28 mg/dL — ABNORMAL HIGH (ref 8–27)
Bilirubin Total: 0.2 mg/dL (ref 0.0–1.2)
CO2: 20 mmol/L (ref 20–29)
Calcium: 8.8 mg/dL (ref 8.7–10.3)
Chloride: 103 mmol/L (ref 96–106)
Creatinine, Ser: 2.64 mg/dL — ABNORMAL HIGH (ref 0.57–1.00)
Globulin, Total: 2.8 g/dL (ref 1.5–4.5)
Glucose: 81 mg/dL (ref 65–99)
Potassium: 4.5 mmol/L (ref 3.5–5.2)
Sodium: 139 mmol/L (ref 134–144)
Total Protein: 6.1 g/dL (ref 6.0–8.5)
eGFR: 17 mL/min/{1.73_m2} — ABNORMAL LOW (ref >59)

## 2021-02-09 LAB — PRO B NATRIURETIC PEPTIDE: NT-Pro BNP: 450 pg/mL (ref 0–738)

## 2021-02-09 LAB — TSH: TSH: 1.54 u[IU]/mL (ref 0.450–4.500)

## 2021-02-11 ENCOUNTER — Other Ambulatory Visit: Payer: Self-pay

## 2021-02-11 ENCOUNTER — Inpatient Hospital Stay (HOSPITAL_COMMUNITY): Payer: Medicare Other

## 2021-02-11 ENCOUNTER — Ambulatory Visit (HOSPITAL_COMMUNITY)
Admission: RE | Admit: 2021-02-11 | Discharge: 2021-02-11 | Disposition: A | Discharge status: Home / Self Care | Payer: Medicare Other | Source: Ambulatory Visit | Attending: Physician Assistant | Admitting: Physician Assistant

## 2021-02-11 ENCOUNTER — Inpatient Hospital Stay (HOSPITAL_COMMUNITY): Payer: Medicare Other | Attending: Physician Assistant | Admitting: Hematology

## 2021-02-11 ENCOUNTER — Encounter (HOSPITAL_COMMUNITY): Payer: Self-pay | Admitting: Hematology

## 2021-02-11 VITALS — BP 118/67 | HR 90 | Temp 96.9°F | Resp 18 | Ht 67.0 in | Wt 146.3 lb

## 2021-02-11 DIAGNOSIS — N184 Chronic kidney disease, stage 4 (severe): Secondary | ICD-10-CM | POA: Insufficient documentation

## 2021-02-11 DIAGNOSIS — Z833 Family history of diabetes mellitus: Secondary | ICD-10-CM | POA: Diagnosis not present

## 2021-02-11 DIAGNOSIS — R609 Edema, unspecified: Secondary | ICD-10-CM | POA: Diagnosis not present

## 2021-02-11 DIAGNOSIS — I130 Hypertensive heart and chronic kidney disease with heart failure and stage 1 through stage 4 chronic kidney disease, or unspecified chronic kidney disease: Secondary | ICD-10-CM | POA: Insufficient documentation

## 2021-02-11 DIAGNOSIS — R778 Other specified abnormalities of plasma proteins: Secondary | ICD-10-CM | POA: Diagnosis not present

## 2021-02-11 DIAGNOSIS — Z832 Family history of diseases of the blood and blood-forming organs and certain disorders involving the immune mechanism: Secondary | ICD-10-CM | POA: Diagnosis not present

## 2021-02-11 DIAGNOSIS — R55 Syncope and collapse: Secondary | ICD-10-CM | POA: Diagnosis not present

## 2021-02-11 DIAGNOSIS — D631 Anemia in chronic kidney disease: Secondary | ICD-10-CM | POA: Diagnosis not present

## 2021-02-11 DIAGNOSIS — Z886 Allergy status to analgesic agent status: Secondary | ICD-10-CM | POA: Insufficient documentation

## 2021-02-11 DIAGNOSIS — Z8249 Family history of ischemic heart disease and other diseases of the circulatory system: Secondary | ICD-10-CM | POA: Diagnosis not present

## 2021-02-11 DIAGNOSIS — E1122 Type 2 diabetes mellitus with diabetic chronic kidney disease: Secondary | ICD-10-CM | POA: Insufficient documentation

## 2021-02-11 DIAGNOSIS — M7989 Other specified soft tissue disorders: Secondary | ICD-10-CM | POA: Insufficient documentation

## 2021-02-11 DIAGNOSIS — Z79899 Other long term (current) drug therapy: Secondary | ICD-10-CM | POA: Diagnosis not present

## 2021-02-11 DIAGNOSIS — Z841 Family history of disorders of kidney and ureter: Secondary | ICD-10-CM | POA: Diagnosis not present

## 2021-02-11 DIAGNOSIS — D472 Monoclonal gammopathy: Secondary | ICD-10-CM | POA: Diagnosis not present

## 2021-02-11 DIAGNOSIS — Z888 Allergy status to other drugs, medicaments and biological substances status: Secondary | ICD-10-CM | POA: Insufficient documentation

## 2021-02-11 DIAGNOSIS — I5032 Chronic diastolic (congestive) heart failure: Secondary | ICD-10-CM | POA: Insufficient documentation

## 2021-02-11 DIAGNOSIS — R5383 Other fatigue: Secondary | ICD-10-CM | POA: Insufficient documentation

## 2021-02-11 DIAGNOSIS — R634 Abnormal weight loss: Secondary | ICD-10-CM | POA: Insufficient documentation

## 2021-02-11 LAB — CBC WITH DIFFERENTIAL/PLATELET
Abs Immature Granulocytes: 0.02 10*3/uL (ref 0.00–0.07)
Basophils Absolute: 0 10*3/uL (ref 0.0–0.1)
Basophils Relative: 1 %
Eosinophils Absolute: 0.2 10*3/uL (ref 0.0–0.5)
Eosinophils Relative: 3 %
HCT: 31.1 % — ABNORMAL LOW (ref 36.0–46.0)
Hemoglobin: 9.7 g/dL — ABNORMAL LOW (ref 12.0–15.0)
Immature Granulocytes: 0 %
Lymphocytes Relative: 33 %
Lymphs Abs: 2 10*3/uL (ref 0.7–4.0)
MCH: 28.4 pg (ref 26.0–34.0)
MCHC: 31.2 g/dL (ref 30.0–36.0)
MCV: 90.9 fL (ref 80.0–100.0)
Monocytes Absolute: 0.6 10*3/uL (ref 0.1–1.0)
Monocytes Relative: 10 %
Neutro Abs: 3.3 10*3/uL (ref 1.7–7.7)
Neutrophils Relative %: 53 %
Platelets: 344 10*3/uL (ref 150–400)
RBC: 3.42 MIL/uL — ABNORMAL LOW (ref 3.87–5.11)
RDW: 16 % — ABNORMAL HIGH (ref 11.5–15.5)
WBC: 6.2 10*3/uL (ref 4.0–10.5)
nRBC: 0 % (ref 0.0–0.2)

## 2021-02-11 LAB — COMPREHENSIVE METABOLIC PANEL
ALT: 17 U/L (ref 0–44)
AST: 23 U/L (ref 15–41)
Albumin: 2.9 g/dL — ABNORMAL LOW (ref 3.5–5.0)
Alkaline Phosphatase: 74 U/L (ref 38–126)
Anion gap: 7 (ref 5–15)
BUN: 31 mg/dL — ABNORMAL HIGH (ref 8–23)
CO2: 23 mmol/L (ref 22–32)
Calcium: 8.5 mg/dL — ABNORMAL LOW (ref 8.9–10.3)
Chloride: 106 mmol/L (ref 98–111)
Creatinine, Ser: 2.58 mg/dL — ABNORMAL HIGH (ref 0.44–1.00)
GFR, Estimated: 18 mL/min — ABNORMAL LOW (ref >60)
Glucose, Bld: 109 mg/dL — ABNORMAL HIGH (ref 70–99)
Potassium: 4.5 mmol/L (ref 3.5–5.1)
Sodium: 136 mmol/L (ref 135–145)
Total Bilirubin: 0.6 mg/dL (ref 0.3–1.2)
Total Protein: 6.1 g/dL — ABNORMAL LOW (ref 6.5–8.1)

## 2021-02-11 LAB — LACTATE DEHYDROGENASE: LDH: 205 U/L — ABNORMAL HIGH (ref 98–192)

## 2021-02-11 NOTE — Progress Notes (Signed)
[facility] [address] [place] [address]   CLINIC:  Medical Oncology/Hematology  CONSULT NOTE  Patient Care Team: Rosita Fire, MD as PCP - General (Internal Medicine)  CHIEF COMPLAINTS/PURPOSE OF CONSULTATION:  MGUS  HISTORY OF PRESENTING ILLNESS:  Lynn Mcclure 87 y.o. female is here at the request of Dr. Theador Hawthorne (nephrology) due to MGUS.   Lynn Mcclure follows with Dr. Theador Hawthorne for her CKD stage IV, with baseline creatinine 2.2-2.5 secondary to diabetes mellitus, hypertension, and age.  She was referred to hematology due to abnormal labs on 07/26/2020 during work-up of CKD.  SPEP showed poorly defined band of restricted protein mobility in the gammaglobulin region, and serum IFE showed a faint IgG lambda monoclonal protein.  Urine IFE showed polyclonal proteins, but was negative for monoclonal gammopathy.  Kappa light chains elevated at 74.8, lambda light chains elevated at 61.0, light chain ratio normal at 1.23.  Other labs (02/08/2021) significant for creatinine 2.64, calcium 8.8, Hgb 9.7, MCV 89.0.  Patient reports she is doing well today.  Her biggest concern today is her persistent peripheral edema related to her heart failure and stage IV kidney failure.  She denies any recent fevers, infections, or hospitalizations. She denies any new bone pain or recent fractures. She denies any B symptoms.   Denies any nausea, vomiting, or diarrhea. Denies any new pains.   She has some intermittent tinnitus for the past few years.   No new neurologic symptoms such as new-onset hearing loss, blurred vision, headache, or dizziness.  Denies any numbness or tingling in hands or feet. No thromboembolic events since her last visit. Has not noticed any recent bleeding such as epistaxis, hematuria, hematemesis, melena, or hematochezia.  Denies recent chest pain on exertion, shortness of breath on minimal exertion, pre-syncopal episodes, or palpitations.  No new  masses or lymphadenopathy per her report.   Patient reports appetite at 50% and energy level at 75%. She is slowly losing weight, possibly due to low appetite.  Her other PMH is notable for CKD stage IV (baseline creatinine 2.2-2.5), diabetes mellitus, hypertension, anemia of chronic disease, and diastolic congestive heart failure, and other conditions noted elsewhere in her medical record.  She lives at home alone.  She worked in a variety of [employer] before retiring.  She is a lifelong nonsmoker; no alcohol or illicit drug use.  Her family history is positive for diabetes, kidney disease, and heart disease.  No family history of cancer or blood clots.  MEDICAL HISTORY:  Past Medical History:  Diagnosis Date  . CKD (chronic kidney disease), stage III ([place])   . Diabetes mellitus   . Hyperlipidemia   . Hypertension   . Normocytic anemia   . Osteoarthritis   . Seborrheic keratosis     SURGICAL HISTORY: Past Surgical History:  Procedure Laterality Date  . COLONOSCOPY  2014    SOCIAL HISTORY: Social History   Socioeconomic History  . Marital status: Widowed    Spouse name: Not on file  . Number of children: Not on file  . Years of education: 74  . Highest education level: Not on file  Occupational History  . Occupation: Retired Systems developer, Armed forces logistics/support/administrative officer  Tobacco Use  . Smoking status: Never Smoker  . Smokeless tobacco: Never Used  Substance and Sexual Activity  . Alcohol use: No  . Drug use: No  . Sexual activity: Not on file  Other Topics Concern  . Not on file  Social History  Narrative   Lives alone but close to kids, apartments are connected   Social Determinants of Health   Financial Resource Strain: Low Risk   . Difficulty of Paying Living Expenses: Not hard at all  Food Insecurity: No Food Insecurity  . Worried About Charity fundraiser in the Last Year: Never true  . Ran Out of Food in the Last Year: Never true  Transportation Needs: No Transportation Needs   . Lack of Transportation (Medical): No  . Lack of Transportation (Non-Medical): No  Physical Activity: Insufficiently Active  . Days of Exercise per Week: 2 days  . Minutes of Exercise per Session: 20 min  Stress: No Stress Concern Present  . Feeling of Stress : Not at all  Social Connections: Socially Isolated  . Frequency of Communication with Friends and Family: More than three times a week  . Frequency of Social Gatherings with Friends and Family: More than three times a week  . Attends Religious Services: Never  . Active Member of Clubs or Organizations: No  . Attends Archivist Meetings: Never  . Marital Status: Widowed  Intimate Partner Violence: Not At Risk  . Fear of Current or Ex-Partner: No  . Emotionally Abused: No  . Physically Abused: No  . Sexually Abused: No    FAMILY HISTORY: Family History  Problem Relation Age of Onset  . Heart attack Mother   . Coronary artery disease Brother   . Anemia Sister   . Diabetes Sister     ALLERGIES:  is allergic to atenolol, etodolac, furosemide, red dye, rosiglitazone-glimepiride, and asa [aspirin].  MEDICATIONS:  Current Outpatient Medications  Medication Sig Dispense Refill  . atorvastatin (LIPITOR) 20 MG tablet Take 20 mg by mouth daily.    . furosemide (LASIX) 20 MG tablet Take 1 tablet (20 mg total) by mouth daily as needed for edema. 30 tablet 0  . glyBURIDE (DIABETA) 5 MG tablet Take 5 mg by mouth daily with breakfast.    . Iron-Vitamins (S.S.S. TONIC PO) Take 5 mLs by mouth daily.    . isosorbide mononitrate (ISMO,MONOKET) 10 MG tablet Take 10 mg by mouth 2 (two) times daily.    . metoprolol tartrate (LOPRESSOR) 25 MG tablet Take 25 mg by mouth 2 (two) times daily.    . paricalcitol (ZEMPLAR) 1 MCG capsule Take 1 mcg by mouth daily.    . potassium chloride (K-DUR) 10 MEQ tablet Take 1 tablet by mouth daily.    . vitamin C (ASCORBIC ACID) 500 MG tablet Take 500 mg by mouth daily.    . Vitamin D,  Cholecalciferol, 25 MCG (1000 UT) TABS Take by mouth.    . zinc gluconate 50 MG tablet Take 50 mg by mouth daily.    Marland Kitchen acetaminophen (TYLENOL) 500 MG tablet Take 500 mg by mouth every 6 (six) hours as needed for mild pain. (Patient not taking: Reported on 02/11/2021)     No current facility-administered medications for this visit.    REVIEW OF SYSTEMS:   Review of Systems  Constitutional: Positive for fatigue. Negative for appetite change, chills, diaphoresis, fever and unexpected weight change.  HENT:   Negative for lump/mass and nosebleeds.   Eyes: Negative for eye problems.  Respiratory: Negative for cough, hemoptysis and shortness of breath.   Cardiovascular: Positive for leg swelling. Negative for chest pain and palpitations.  Gastrointestinal: Negative for abdominal pain, blood in stool, constipation, diarrhea, nausea and vomiting.  Genitourinary: Negative for hematuria.   Skin: Negative.  Neurological: Negative for dizziness, headaches and light-headedness.  Hematological: Does not bruise/bleed easily.      PHYSICAL EXAMINATION: ECOG PERFORMANCE STATUS: 1 - Symptomatic but completely ambulatory  Vitals:   02/11/21 0857  BP: 118/67  Pulse: 90  Resp: 18  Temp: (!) 96.9 F (36.1 C)  SpO2: 100%   Filed Weights   02/11/21 0857  Weight: 146 lb 4.8 oz (66.4 kg)    Physical Exam Constitutional:      Appearance: Normal appearance.  HENT:     Head: Normocephalic and atraumatic.     Mouth/Throat:     Mouth: Mucous membranes are moist.  Eyes:     Extraocular Movements: Extraocular movements intact.     Pupils: Pupils are equal, round, and reactive to light.  Cardiovascular:     Rate and Rhythm: Normal rate and regular rhythm.     Pulses: Normal pulses.     Heart sounds: Normal heart sounds.  Pulmonary:     Effort: Pulmonary effort is normal.     Breath sounds: Normal breath sounds.  Abdominal:     General: Bowel sounds are normal.     Palpations: Abdomen is soft.      Tenderness: There is no abdominal tenderness.  Musculoskeletal:        General: No swelling.     Right lower leg: Edema present.     Left lower leg: Edema present.  Lymphadenopathy:     Cervical: No cervical adenopathy.  Skin:    General: Skin is warm and dry.  Neurological:     General: No focal deficit present.     Mental Status: She is alert and oriented to person, place, and time.  Psychiatric:        Mood and Affect: Mood normal.        Behavior: Behavior normal.       LABORATORY DATA:  I have reviewed the data as listed Recent Results (from the past 2160 hour(s))  CBC     Status: Abnormal   Collection Time: 02/08/21  4:06 PM  Result Value Ref Range   WBC 5.9 3.4 - 10.8 x10E3/uL   RBC 3.48 (L) 3.77 - 5.28 x10E6/uL   Hemoglobin 9.7 (L) 11.1 - 15.9 g/dL   Hematocrit 31.0 (L) 34.0 - 46.6 %   MCV 89 79 - 97 fL   MCH 27.9 26.6 - 33.0 pg   MCHC 31.3 (L) 31.5 - 35.7 g/dL   RDW 12.9 11.7 - 15.4 %   Platelets 332 150 - 450 x10E3/uL  Comprehensive metabolic panel     Status: Abnormal   Collection Time: 02/08/21  4:06 PM  Result Value Ref Range   Glucose 81 65 - 99 mg/dL   BUN 28 (H) 8 - 27 mg/dL   Creatinine, Ser 2.64 (H) 0.57 - 1.00 mg/dL   eGFR 17 (L) >59 mL/min/1.73   BUN/Creatinine Ratio 11 (L) 12 - 28   Sodium 139 134 - 144 mmol/L   Potassium 4.5 3.5 - 5.2 mmol/L   Chloride 103 96 - 106 mmol/L   CO2 20 20 - 29 mmol/L   Calcium 8.8 8.7 - 10.3 mg/dL   Total Protein 6.1 6.0 - 8.5 g/dL   Albumin 3.3 (L) 3.6 - 4.6 g/dL   Globulin, Total 2.8 1.5 - 4.5 g/dL   Albumin/Globulin Ratio 1.2 1.2 - 2.2   Bilirubin Total 0.2 0.0 - 1.2 mg/dL   Alkaline Phosphatase 87 44 - 121 IU/L   AST 19 0 - 40  IU/L   ALT 14 0 - 32 IU/L  TSH     Status: None   Collection Time: 02/08/21  4:06 PM  Result Value Ref Range   TSH 1.540 0.450 - 4.500 uIU/mL  Pro b natriuretic peptide (BNP)     Status: None   Collection Time: 02/08/21  4:06 PM  Result Value Ref Range   NT-Pro BNP 450 0 -  738 pg/mL    Comment: The following cut-points have been suggested for the use of proBNP for the diagnostic evaluation of heart failure (HF) in patients with acute dyspnea: Modality                     Age           Optimal Cut                            (years)            Point ------------------------------------------------------ Diagnosis (rule in HF)        <50            450 pg/mL                           50 - 75            900 pg/mL                               >75           1800 pg/mL Exclusion (rule out HF)  Age independent     300 pg/mL   CBC with Differential/Platelet     Status: Abnormal   Collection Time: 02/11/21 11:46 AM  Result Value Ref Range   WBC 6.2 4.0 - 10.5 K/uL   RBC 3.42 (L) 3.87 - 5.11 MIL/uL   Hemoglobin 9.7 (L) 12.0 - 15.0 g/dL   HCT 31.1 (L) 36.0 - 46.0 %   MCV 90.9 80.0 - 100.0 fL   MCH 28.4 26.0 - 34.0 pg   MCHC 31.2 30.0 - 36.0 g/dL   RDW 16.0 (H) 11.5 - 15.5 %   Platelets 344 150 - 400 K/uL   nRBC 0.0 0.0 - 0.2 %   Neutrophils Relative % 53 %   Neutro Abs 3.3 1.7 - 7.7 K/uL   Lymphocytes Relative 33 %   Lymphs Abs 2.0 0.7 - 4.0 K/uL   Monocytes Relative 10 %   Monocytes Absolute 0.6 0.1 - 1.0 K/uL   Eosinophils Relative 3 %   Eosinophils Absolute 0.2 0.0 - 0.5 K/uL   Basophils Relative 1 %   Basophils Absolute 0.0 0.0 - 0.1 K/uL   Immature Granulocytes 0 %   Abs Immature Granulocytes 0.02 0.00 - 0.07 K/uL    Comment: Performed at [facility], [address] [place], [place] [address]  Comprehensive metabolic panel     Status: Abnormal   Collection Time: 02/11/21 11:46 AM  Result Value Ref Range   Sodium 136 135 - 145 mmol/L   Potassium 4.5 3.5 - 5.1 mmol/L   Chloride 106 98 - 111 mmol/L   CO2 23 22 - 32 mmol/L   Glucose, Bld 109 (H) 70 - 99 mg/dL    Comment: Glucose reference range applies only to samples taken after fasting for at least 8 hours.   BUN 31 (  H) 8 - 23 mg/dL   Creatinine, Ser 2.58 (H) 0.44 - 1.00 mg/dL   Calcium 8.5  (L) 8.9 - 10.3 mg/dL   Total Protein 6.1 (L) 6.5 - 8.1 g/dL   Albumin 2.9 (L) 3.5 - 5.0 g/dL   AST 23 15 - 41 U/L   ALT 17 0 - 44 U/L   Alkaline Phosphatase 74 38 - 126 U/L   Total Bilirubin 0.6 0.3 - 1.2 mg/dL   GFR, Estimated 18 (L) >60 mL/min    Comment: (NOTE) Calculated using the CKD-EPI Creatinine Equation (2021)    Anion gap 7 5 - 15    Comment: Performed at [facility], [address], [place], [place] [address]  Lactate dehydrogenase     Status: Abnormal   Collection Time: 02/11/21 11:46 AM  Result Value Ref Range   LDH 205 (H) 98 - 192 U/L    Comment: Performed at [facility], [address], [place],  [address]    RADIOGRAPHIC STUDIES: I have personally reviewed the radiological images as listed and agreed with the findings in the report. No results found.  Ms. Turvey follows with Dr. Theador Hawthorne for her CKD stage IV, with baseline creatinine 2.2-2.5 secondary to diabetes mellitus, hypertension, and age.  She was referred to hematology due to abnormal labs on 07/26/2020 during work-up of CKD.  SPEP showed poorly defined band of restricted protein mobility in the gammaglobulin region, and serum IFE showed a faint IgG lambda monoclonal protein.  UrineIFE showed polyclonal proteins, but was negative for monoclonal gammopathy.  Kappa light chains elevated at 74.8, lambda light chains elevated at 61.0, light chain ratio normal at 1.23.  Other labs (12/17/2020) significant for creatinine 2.33, calcium 7.9, Hgb 10.0, MCV 90.1.  ASSESSMENT: 1.  Monoclonal gammopathy of unknown significance, IgG lambda - Faint M spike discovered during work-up for CKD stage IV - Abnormal labs (07/26/2020) during work-up for CKD, with SPEP showing poorly defined band of restricted protein mobility in the gammaglobulin region - Serum IFE shows faint IgG lambda monoclonal protein; urine IFE negative for monoclonal gammopathy - Free light chains (07/26/2020) show elevated kappa 74.8, elevated lambda  61.0, normal light chain ratio 1.23. - Other labs (02/08/2021) significant for creatinine 2.64, calcium 8.8, Hgb 9.7, MCV 89.0. - Asymptomatic apart from fatigue  - No definite CRAB features: Elevated creatinine and low hemoglobin attributable to other causes as below  2.  Anemia of CKD - Being monitored by Dr. Theador Hawthorne (nephrology) along with patient's CKD - Labs reviewed (12/17/2020) show normocytic anemia with Hgb 10.0 and MCV 90.1 - Hgb currently at goal, no need for erythropoietin stimulating agents at this time  3.  CKD stage IV - Follows with Dr. Theador Hawthorne for CKD stage IV secondary to diabetes mellitus, hypertension, and age - Baseline creatinine 2.2-2.5  4.  Other history - PMH: PMH is notable for CKD stage IV (baseline creatinine 2.2-2.5), diabetes mellitus, hypertension, anemia of chronic disease, and diastolic congestive heart failure, and other conditions noted elsewhere in her medical record. - She lives at home alone.  She worked in a variety of [employer] before retiring.  She is a lifelong nonsmoker; no alcohol or illicit drug use. - Her family history is positive for diabetes, kidney disease, and heart disease.  No family history of cancer or blood clots.   PLAN:  1.  Monoclonal gammopathy of unknown significance, IgG lambda -Work-up for plasma cell disorder (MGUS versus multiple myeloma) with SPEP, serum IFE, CBC, CMP, LDH, beta-2  microglobulin -Obtain skeletal survey -Consider bone marrow biopsy, depending on the above results -Return to clinic in 2-3 weeks to discuss results and next steps   PLAN SUMMARY & DISPOSITION: -Labs and skeletal survey today - RTC in 2 to 3 weeks to discuss results  All questions were answered. The patient knows to call the clinic with any problems, questions or concerns.   Medical decision making: Moderate (1 new problem under work-up, review of external notes, review of previous labs, ordering new tests)  Time spent on visit: I spent 25  minutes counseling the patient face to face. The total time spent in the appointment was 40 minutes and more than 50% was on counseling.  I, Tarri Abernethy PA-C, have seen this patient in conjunction with Dr. Derek Jack. Greater than 50% of visit was performed by Dr. Delton Coombes.  Addendum: I have independently evaluated this patient face-to-face and formulated my assessment and plan.  I agree with HPI written by Casey Burkitt, PA-C.  She had SPEP which was abnormal on work-up for CKD by Dr. Theador Hawthorne.  We have reviewed plasma cell disorders in general.  I have recommended testing for SPEP, immunofixation and free light chains.  We will reevaluate her in 2 to 3 weeks to discuss results.  Urine studies were already performed by Dr. Verne Spurr.    Derek Jack, MD 02/11/21 5:58 PM

## 2021-02-11 NOTE — Patient Instructions (Addendum)
Lynn Mcclure at [facility] Discharge Instructions  You were seen today by Dr. Delton Coombes and Tarri Abernethy PA-C for your Monocolonal Gammopathy of Unknown Significance (a.k.a. MGUS - abnormal protein).  This is most likely nothing to worry bout, but does have an increased risk of progression to a type of cancer known as multiple myeloma.  You do NOT need treatment at this time, but we will watch you closely to make sure you are stable.  LABS: Get labs today before leaving the hospital (on the first floor)   OTHER TESTS: Whole-body Xrays  MEDICATIONS: No changes  FOLLOW-UP APPOINTMENT: Follow up in 3-4 weeks   Thank you for choosing [facility] at [facility] to provide your oncology and hematology care.  To afford each patient quality time with our provider, please arrive at least 15 minutes before your scheduled appointment time.   If you have a lab appointment with the [facility] please come in thru the Main Entrance and check in at the main information desk.  You need to re-schedule your appointment should you arrive 10 or more minutes late.  We strive to give you quality time with our providers, and arriving late affects you and other patients whose appointments are after yours.  Also, if you no show three or more times for appointments you may be dismissed from the clinic at the providers discretion.     Again, thank you for choosing [facility].  Our hope is that these requests will decrease the amount of time that you wait before being seen by our physicians.       _____________________________________________________________  Should you have questions after your visit to [facility], please contact our office at [phone] and follow the prompts.  Our office hours are 8:00 a.m. and 4:30 p.m. Monday - Friday.  Please note that voicemails left after 4:00 p.m. may not be returned until the following business  day.  We are closed weekends and major holidays.  You do have access to a nurse 24-7, just call the main number to the clinic [phone] and do not press any options, hold on the line and a nurse will answer the phone.    For prescription refill requests, have your pharmacy contact our office and allow 72 hours.    Due to Covid, you will need to wear a mask upon entering the hospital. If you do not have a mask, a mask will be given to you at the Main Entrance upon arrival. For doctor visits, patients may have 1 support person age 90 or older with them. For treatment visits, patients can not have anyone with them due to social distancing guidelines and our immunocompromised population.

## 2021-02-12 DIAGNOSIS — R600 Localized edema: Secondary | ICD-10-CM | POA: Diagnosis not present

## 2021-02-12 DIAGNOSIS — N184 Chronic kidney disease, stage 4 (severe): Secondary | ICD-10-CM | POA: Diagnosis not present

## 2021-02-12 DIAGNOSIS — E119 Type 2 diabetes mellitus without complications: Secondary | ICD-10-CM | POA: Diagnosis not present

## 2021-02-12 DIAGNOSIS — I10 Essential (primary) hypertension: Secondary | ICD-10-CM | POA: Diagnosis not present

## 2021-02-12 LAB — KAPPA/LAMBDA LIGHT CHAINS
Kappa free light chain: 67.2 mg/L — ABNORMAL HIGH (ref 3.3–19.4)
Kappa, lambda light chain ratio: 0.89 (ref 0.26–1.65)
Lambda free light chains: 75.1 mg/L — ABNORMAL HIGH (ref 5.7–26.3)

## 2021-02-13 LAB — PROTEIN ELECTROPHORESIS, SERUM
A/G Ratio: 1.1 (ref 0.7–1.7)
Albumin ELP: 3.2 g/dL (ref 2.9–4.4)
Alpha-1-Globulin: 0.2 g/dL (ref 0.0–0.4)
Alpha-2-Globulin: 0.7 g/dL (ref 0.4–1.0)
Beta Globulin: 0.6 g/dL — ABNORMAL LOW (ref 0.7–1.3)
Gamma Globulin: 1.3 g/dL (ref 0.4–1.8)
Globulin, Total: 2.8 g/dL (ref 2.2–3.9)
M-Spike, %: 0.2 g/dL — ABNORMAL HIGH
Total Protein ELP: 6 g/dL (ref 6.0–8.5)

## 2021-02-13 LAB — IMMUNOFIXATION ELECTROPHORESIS
IgA: 265 mg/dL (ref 64–422)
IgG (Immunoglobin G), Serum: 1556 mg/dL (ref 586–1602)
IgM (Immunoglobulin M), Srm: 70 mg/dL (ref 26–217)
Total Protein ELP: 6.1 g/dL (ref 6.0–8.5)

## 2021-02-13 LAB — BETA 2 MICROGLOBULIN, SERUM: Beta-2 Microglobulin: 5.5 mg/L — ABNORMAL HIGH (ref 0.6–2.4)

## 2021-02-15 ENCOUNTER — Telehealth: Payer: Self-pay | Admitting: *Deleted

## 2021-02-15 DIAGNOSIS — R600 Localized edema: Secondary | ICD-10-CM

## 2021-02-15 MED ORDER — FUROSEMIDE 20 MG PO TABS: ORAL_TABLET | ORAL | 3 refills | Status: DC

## 2021-02-15 NOTE — Telephone Encounter (Signed)
Spoke with patient and her granddaughter. Reviewed results and recommendatins.  Pt takes lasix 20 mg tablets -40 mg daily. She will change to 40 mg daily, alternating with 60 mg every other day. Will return for labs in one week - June 3. Has echo on June 1.

## 2021-02-15 NOTE — Telephone Encounter (Signed)
Yes, 40 alternating with 60 daily

## 2021-02-15 NOTE — Telephone Encounter (Signed)
-----   Message from Fay Records, MD sent at 02/09/2021  9:52 PM EDT ----- Hgb is relatively stable   9.7   Mild anemia Thyroid function is normal Electrolytes are OK   Albumin a little low     Kidney function is down some when compared to Aug 2021   Cr is now 2.64   This may explain some why accumulating edema She is scheduled for echo   Very importanT Confirm lasix dose again.   Written that she is on 20 mg as needed Would increase to 60 mg every other day with 10 KCL F/U BMET on Friday

## 2021-02-20 ENCOUNTER — Ambulatory Visit (HOSPITAL_COMMUNITY)
Admission: RE | Admit: 2021-02-20 | Discharge: 2021-02-20 | Disposition: A | Discharge status: Home / Self Care | Payer: Medicare Other | Source: Ambulatory Visit | Attending: Internal Medicine | Admitting: Internal Medicine

## 2021-02-20 ENCOUNTER — Other Ambulatory Visit: Payer: Self-pay

## 2021-02-20 DIAGNOSIS — R600 Localized edema: Secondary | ICD-10-CM | POA: Insufficient documentation

## 2021-02-20 LAB — ECHOCARDIOGRAM COMPLETE
AR max vel: 2.61 cm2
AV Area VTI: 2.56 cm2
AV Area mean vel: 2.66 cm2
AV Mean grad: 3.5 mmHg
AV Peak grad: 6.7 mmHg
Ao pk vel: 1.3 m/s
Area-P 1/2: 2.21 cm2
S' Lateral: 2.5 cm

## 2021-02-20 NOTE — Progress Notes (Signed)
*  PRELIMINARY RESULTS* Echocardiogram 2D Echocardiogram has been performed.  Lynn Mcclure 02/20/2021, 3:45 PM

## 2021-02-22 ENCOUNTER — Other Ambulatory Visit (HOSPITAL_COMMUNITY)
Admission: RE | Admit: 2021-02-22 | Discharge: 2021-02-22 | Disposition: A | Discharge status: Home / Self Care | Payer: Medicare Other | Source: Ambulatory Visit | Attending: Internal Medicine | Admitting: Internal Medicine

## 2021-02-22 DIAGNOSIS — R600 Localized edema: Secondary | ICD-10-CM | POA: Diagnosis not present

## 2021-02-22 LAB — BASIC METABOLIC PANEL
Anion gap: 11 (ref 5–15)
BUN: 31 mg/dL — ABNORMAL HIGH (ref 8–23)
CO2: 26 mmol/L (ref 22–32)
Calcium: 8.4 mg/dL — ABNORMAL LOW (ref 8.9–10.3)
Chloride: 99 mmol/L (ref 98–111)
Creatinine, Ser: 2.74 mg/dL — ABNORMAL HIGH (ref 0.44–1.00)
GFR, Estimated: 16 mL/min — ABNORMAL LOW (ref >60)
Glucose, Bld: 201 mg/dL — ABNORMAL HIGH (ref 70–99)
Potassium: 4.6 mmol/L (ref 3.5–5.1)
Sodium: 136 mmol/L (ref 135–145)

## 2021-03-04 NOTE — Progress Notes (Signed)
Lynn Mcclure, Lynn Mcclure [id number]   CLINIC:  Medical Oncology/Hematology  PCP:  Rosita Fire, MD [name]  [id number] [phone]   REASON FOR VISIT:  Follow-up for MGUS and anemia of CKD  PRIOR THERAPY: None  CURRENT THERAPY: Under work-up  INTERVAL HISTORY:  Lynn Mcclure 87 y.o. female returns for routine follow-up of her recently diagnosed MGUS.  She was seen for initial consultation by Tarri Abernethy PA-C and Dr. Delton Coombes on 02/11/2021.  At today's visit, patient reports that she is doing fairly well Her biggest concern continues to be her persistent peripheral edema secondary to heart failure and stage IV kidney failure.    The patients daughter is concerned that her mother does not drink enough water, and the patient admits that she only drinks about 8 oz of water per day.  She denies any recent fevers, infections, or hospitalizations. She denies any new bone pain or recent fractures. She denies any B symptoms.   Denies any nausea, vomiting, or diarrhea. Denies any new pains.    She has some intermittent tinnitus for the past few years.   No new neurologic symptoms such as new-onset hearing loss, blurred vision, headache, or dizziness.  Denies any numbness or tingling in hands or feet. No history of thromboembolic events.  Has not noticed any recent bleeding such as epistaxis, hematuria, hematemesis, melena, or hematochezia. Denies recent chest pain on exertion, shortness of breath on minimal exertion, pre-syncopal episodes, or palpitations. No new masses or lymphadenopathy per her report.  She has 75% energy and 100% appetite. She is slowly losing weight, possibly due to low appetite.     REVIEW OF SYSTEMS:  Review of Systems  Constitutional:  Positive for fatigue. Negative for appetite change, chills, diaphoresis, fever and unexpected weight change.  HENT:   Negative for lump/mass and nosebleeds.    Eyes:  Negative for eye problems.  Respiratory:  Negative for cough, hemoptysis and shortness of breath.   Cardiovascular:  Positive for leg swelling. Negative for chest pain and palpitations.  Gastrointestinal:  Negative for abdominal pain, blood in stool, constipation, diarrhea, nausea and vomiting.  Genitourinary:  Negative for hematuria.   Skin: Negative.   Neurological:  Negative for dizziness, headaches and light-headedness.  Hematological:  Does not bruise/bleed easily.     PAST MEDICAL/SURGICAL HISTORY:  Past Medical History:  Diagnosis Date   CKD (chronic kidney disease), stage III ([place])    Diabetes mellitus    Hyperlipidemia    Hypertension    Normocytic anemia    Osteoarthritis    Seborrheic keratosis    Past Surgical History:  Procedure Laterality Date   COLONOSCOPY  2014     SOCIAL HISTORY:  Social History   Socioeconomic History   Marital status: Widowed    Spouse name: Not on file   Number of children: Not on file   Years of education: 12   Highest education level: Not on file  Occupational History   Occupation: Retired Systems developer, Armed forces logistics/support/administrative officer  Tobacco Use   Smoking status: Never   Smokeless tobacco: Never  Substance and Sexual Activity   Alcohol use: No   Drug use: No   Sexual activity: Not on file  Other Topics Concern   Not on file  Social History Narrative   Lives alone but close to kids, apartments are connected   Social Determinants of Health   Financial Resource Strain: Low Risk    Difficulty of  Paying Living Expenses: Not hard at all  Food Insecurity: No Food Insecurity   Worried About [place] in the Last Year: Never true   Ran Out of Food in the Last Year: Never true  Transportation Needs: No Transportation Needs   Lack of Transportation (Medical): No   Lack of Transportation (Non-Medical): No  Physical Activity: Insufficiently Active   Days of Exercise per Week: 2 days   Minutes of Exercise per Session: 20 min   Stress: No Stress Concern Present   Feeling of Stress : Not at all  Social Connections: Socially Isolated   Frequency of Communication with Friends and Family: More than three times a week   Frequency of Social Gatherings with Friends and Family: More than three times a week   Attends Religious Services: Never   Marine scientist or Organizations: No   Attends Archivist Meetings: Never   Marital Status: Widowed  Human resources officer Violence: Not At Risk   Fear of Current or Ex-Partner: No   Emotionally Abused: No   Physically Abused: No   Sexually Abused: No    FAMILY HISTORY:  Family History  Problem Relation Age of Onset   Heart attack Mother    Coronary artery disease Brother    Anemia Sister    Diabetes Sister     CURRENT MEDICATIONS:  Outpatient Encounter Medications as of 03/05/2021  Medication Sig   acetaminophen (TYLENOL) 500 MG tablet Take 500 mg by mouth every 6 (six) hours as needed for mild pain. (Patient not taking: Reported on 02/11/2021)   atorvastatin (LIPITOR) 20 MG tablet Take 20 mg by mouth daily.   furosemide (LASIX) 20 MG tablet Take 2 tablets daily alternating with 3 tablets every other day   glyBURIDE (DIABETA) 5 MG tablet Take 5 mg by mouth daily with breakfast.   Iron-Vitamins (S.S.S. TONIC PO) Take 5 mLs by mouth daily.   isosorbide mononitrate (ISMO,MONOKET) 10 MG tablet Take 10 mg by mouth 2 (two) times daily.   metoprolol tartrate (LOPRESSOR) 25 MG tablet Take 25 mg by mouth 2 (two) times daily.   paricalcitol (ZEMPLAR) 1 MCG capsule Take 1 mcg by mouth daily.   potassium chloride (K-DUR) 10 MEQ tablet Take 1 tablet by mouth daily.   vitamin C (ASCORBIC ACID) 500 MG tablet Take 500 mg by mouth daily.   Vitamin D, Cholecalciferol, 25 MCG (1000 UT) TABS Take by mouth.   zinc gluconate 50 MG tablet Take 50 mg by mouth daily.   [DISCONTINUED] fluvastatin XL (LESCOL XL) 80 MG 24 hr tablet Take 80 mg by mouth daily.   No  facility-administered encounter medications on file as of 03/05/2021.    ALLERGIES:  Allergies  Allergen Reactions   Atenolol     REACTION: Facial burning sensation   Etodolac     REACTION: Skin rash   Furosemide     REACTION: Itching   Red Dye     Stays away from all food dyes   Rosiglitazone-Glimepiride     REACTION: Fear of MI per tv add   Asa [Aspirin] Rash     PHYSICAL EXAM:  ECOG PERFORMANCE STATUS: 2 - Symptomatic, <50% confined to bed  There were no vitals filed for this visit. There were no vitals filed for this visit. Physical Exam Constitutional:      Appearance: Normal appearance.  HENT:     Head: Normocephalic and atraumatic.     Mouth/Throat:     Mouth: Mucous membranes  are moist.  Eyes:     Extraocular Movements: Extraocular movements intact.     Pupils: Pupils are equal, round, and reactive to light.  Cardiovascular:     Rate and Rhythm: Normal rate and regular rhythm.     Pulses: Normal pulses.     Heart sounds: Normal heart sounds.  Pulmonary:     Effort: Pulmonary effort is normal.     Breath sounds: Normal breath sounds.  Abdominal:     General: Bowel sounds are normal.     Palpations: Abdomen is soft.     Tenderness: There is no abdominal tenderness.  Musculoskeletal:        General: No swelling.     Right lower leg: Edema (trace) present.     Left lower leg: Edema (trace) present.  Lymphadenopathy:     Cervical: No cervical adenopathy.  Skin:    General: Skin is warm and dry.  Neurological:     General: No focal deficit present.     Mental Status: She is alert and oriented to person, place, and time.  Psychiatric:        Mood and Affect: Mood normal.        Behavior: Behavior normal.     LABORATORY DATA:  I have reviewed the labs as listed.  CBC    Component Value Date/Time   WBC 6.2 02/11/2021 1146   RBC 3.42 (L) 02/11/2021 1146   HGB 9.7 (L) 02/11/2021 1146   HGB 9.7 (L) 02/08/2021 1606   HCT 31.1 (L) 02/11/2021 1146    HCT 31.0 (L) 02/08/2021 1606   PLT 344 02/11/2021 1146   PLT 332 02/08/2021 1606   MCV 90.9 02/11/2021 1146   MCV 89 02/08/2021 1606   MCH 28.4 02/11/2021 1146   MCHC 31.2 02/11/2021 1146   RDW 16.0 (H) 02/11/2021 1146   RDW 12.9 02/08/2021 1606   LYMPHSABS 2.0 02/11/2021 1146   MONOABS 0.6 02/11/2021 1146   EOSABS 0.2 02/11/2021 1146   BASOSABS 0.0 02/11/2021 1146   CMP Latest Ref Rng & Units 02/22/2021 02/11/2021 02/08/2021  Glucose 70 - 99 mg/dL 201(H) 109(H) 81  BUN 8 - 23 mg/dL 31(H) 31(H) 28(H)  Creatinine 0.44 - 1.00 mg/dL 2.74(H) 2.58(H) 2.64(H)  Sodium 135 - 145 mmol/L 136 136 139  Potassium 3.5 - 5.1 mmol/L 4.6 4.5 4.5  Chloride 98 - 111 mmol/L 99 106 103  CO2 22 - 32 mmol/L '26 23 20  ' Calcium 8.9 - 10.3 mg/dL 8.4(L) 8.5(L) 8.8  Total Protein 6.5 - 8.1 g/dL - 6.1(L) 6.1  Total Bilirubin 0.3 - 1.2 mg/dL - 0.6 0.2  Alkaline Phos 38 - 126 U/L - 74 87  AST 15 - 41 U/L - 23 19  ALT 0 - 44 U/L - 17 14    DIAGNOSTIC IMAGING:  I have independently reviewed the relevant imaging and discussed with the patient.  ASSESSMENT: 1.  Monoclonal gammopathy of unknown significance, IgG lambda - Faint M spike discovered during work-up for CKD stage IV - Initial work-up by nephrologist showed abnormal labs (07/26/2020) during work-up for CKD, with SPEP showing poorly defined band of restricted protein mobility in the gammaglobulin region; serum IFE shows faint IgG lambda monoclonal protein; urine IFE negative for monoclonal gammopathy; free light chains (07/26/2020) show elevated kappa 74.8, elevated lambda 61.0, normal light chain ratio 1.23. -Hematology work-up (02/11/2021): SPEP with M spike 0.2 Immunofixation shows IgG monoclonal protein with lambda light chain specificity Elevated kappa light chain 67.2, elevated lambda light  chain 75.1, normal ratio 0.89 Elevated beta-2 microglobulin 5.5, elevated LDH 205 Creatinine 2.58, calcium 8.5, Hgb 9.7 - Skeletal survey without focal lytic  lesions (02/11/2021) - Asymptomatic apart from fatigue  - No definite CRAB features: Elevated creatinine and low hemoglobin attributable to other causes as below -- No strong indication for bone marrow biopsy at this time   2.  Anemia of CKD - Being monitored by Dr. Theador Hawthorne (nephrology) along with patient's CKD - Labs reviewed (02/11/2021) show normocytic anemia with Hgb 9.7 and MCV 90.9 - Hgb currently at goal, no need for erythropoietin stimulating agents at this time   3.  CKD stage IV - Follows with Dr. Theador Hawthorne for CKD stage IV secondary to diabetes mellitus, hypertension, and age - Patient sustained AKI earlier in 2022, and her new baseline creatinine is about 2.1-2.5 -- Most recent creatinine 2.74 (02/22/2021)   4.  Other history - PMH: PMH is notable for CKD stage IV (baseline creatinine 2.2-2.5), diabetes mellitus, hypertension, anemia of chronic disease, and diastolic congestive heart failure, and other conditions noted elsewhere in her medical record. - She lives at home alone.  She worked in a variety of [employer] before retiring.  She is a lifelong nonsmoker; no alcohol or illicit drug use. - Her family history is positive for diabetes, kidney disease, and heart disease.  No family history of cancer or blood clots.    PLAN:  1.  Monoclonal gammopathy of unknown significance, IgG lambda --Discussed prognosis of MGUS, with about 1% of patients per year progressing to multiple myeloma - RTC 3 months with follow-up MGUS/myeloma panel  2.  Anemia of CKD - Being monitored by Dr. Theador Hawthorne (nephrology) along with patient's CKD - Labs reviewed (02/11/2021) show normocytic anemia with Hgb 9.7 and MCV 90.9 - We will check for nutritional causes of anemia with ferritin, iron/TIBC, folate, B12, methylmalonic acid, and copper -- If Hgb drops to about 9.0 or less, we will consider initiating ESA injections   3.  CKD stage IV - Continue to follow with Dr. Theador Hawthorne for CKD stage IV secondary to  diabetes mellitus, hypertension, and age -- Patient encouraged to adequately hydrate - about 1.5 L per day   PLAN SUMMARY & DISPOSITION: --RTC in 3 months with labs the week prior to appointment  All questions were answered. The patient knows to call the clinic with any problems, questions or concerns.  Medical decision making: Moderate (2 chronic illnesses, review of prior tests, ordering new tests)  Time spent on visit: I spent 15 minutes counseling the patient face to face. The total time spent in the appointment was 25 minutes and more than 50% was on counseling.   Harriett Rush, PA-C  03/05/21 1:49 PM

## 2021-03-05 ENCOUNTER — Other Ambulatory Visit: Payer: Self-pay

## 2021-03-05 ENCOUNTER — Inpatient Hospital Stay (HOSPITAL_COMMUNITY): Payer: Medicare Other | Attending: Physician Assistant | Admitting: Physician Assistant

## 2021-03-05 VITALS — BP 89/66 | HR 92 | Temp 96.9°F | Resp 18 | Wt 134.7 lb

## 2021-03-05 DIAGNOSIS — Z79899 Other long term (current) drug therapy: Secondary | ICD-10-CM | POA: Diagnosis not present

## 2021-03-05 DIAGNOSIS — Z888 Allergy status to other drugs, medicaments and biological substances status: Secondary | ICD-10-CM | POA: Diagnosis not present

## 2021-03-05 DIAGNOSIS — Z8249 Family history of ischemic heart disease and other diseases of the circulatory system: Secondary | ICD-10-CM | POA: Insufficient documentation

## 2021-03-05 DIAGNOSIS — D649 Anemia, unspecified: Secondary | ICD-10-CM

## 2021-03-05 DIAGNOSIS — I5032 Chronic diastolic (congestive) heart failure: Secondary | ICD-10-CM | POA: Diagnosis not present

## 2021-03-05 DIAGNOSIS — N184 Chronic kidney disease, stage 4 (severe): Secondary | ICD-10-CM | POA: Diagnosis not present

## 2021-03-05 DIAGNOSIS — Z886 Allergy status to analgesic agent status: Secondary | ICD-10-CM | POA: Insufficient documentation

## 2021-03-05 DIAGNOSIS — I130 Hypertensive heart and chronic kidney disease with heart failure and stage 1 through stage 4 chronic kidney disease, or unspecified chronic kidney disease: Secondary | ICD-10-CM | POA: Diagnosis not present

## 2021-03-05 DIAGNOSIS — R7402 Elevation of levels of lactic acid dehydrogenase (LDH): Secondary | ICD-10-CM | POA: Insufficient documentation

## 2021-03-05 DIAGNOSIS — R5383 Other fatigue: Secondary | ICD-10-CM | POA: Insufficient documentation

## 2021-03-05 DIAGNOSIS — D631 Anemia in chronic kidney disease: Secondary | ICD-10-CM | POA: Insufficient documentation

## 2021-03-05 DIAGNOSIS — M7989 Other specified soft tissue disorders: Secondary | ICD-10-CM | POA: Diagnosis not present

## 2021-03-05 DIAGNOSIS — Z832 Family history of diseases of the blood and blood-forming organs and certain disorders involving the immune mechanism: Secondary | ICD-10-CM | POA: Insufficient documentation

## 2021-03-05 DIAGNOSIS — D472 Monoclonal gammopathy: Secondary | ICD-10-CM | POA: Diagnosis not present

## 2021-03-05 DIAGNOSIS — Z833 Family history of diabetes mellitus: Secondary | ICD-10-CM | POA: Insufficient documentation

## 2021-03-05 DIAGNOSIS — R634 Abnormal weight loss: Secondary | ICD-10-CM | POA: Diagnosis not present

## 2021-03-05 NOTE — Patient Instructions (Addendum)
[place] at [facility] Discharge Instructions  You were seen today by Tarri Abernethy PA-C for your MGUS (abnormal protein in your blood).  As we have discussed, about 1% of patients with MGUS per year will progress to a type of blood cancer known as multiple myeloma.  You do not have any current signs or symptoms of multiple myeloma, but we will continue to keep an eye on you.  We will recheck your protein levels and other labs in 3 months, and I will see you at follow-up visit at that time.  Continue to follow-up with your kidney doctor (Dr. Theador Hawthorne) in the meantime.  LABS: Return in 3 months for labs  MEDICATIONS: No changes to your home medications  OTHER:  Drink plenty of water - at least 1.5 L per day! Treat this as seriously as you would treat any other medication.  FOLLOW-UP APPOINTMENT: Office visit in 3 months after getting labs   Thank you for choosing [place] at [facility] to provide your oncology and hematology care.  To afford each patient quality time with our provider, please arrive at least 15 minutes before your scheduled appointment time.   If you have a lab appointment with the [facility] please come in thru the Main Entrance and check in at the main information desk.  You need to re-schedule your appointment should you arrive 10 or more minutes late.  We strive to give you quality time with our providers, and arriving late affects you and other patients whose appointments are after yours.  Also, if you no show three or more times for appointments you may be dismissed from the clinic at the providers discretion.     Again, thank you for choosing [facility].  Our hope is that these requests will decrease the amount of time that you wait before being seen by our physicians.       _____________________________________________________________  Should you have questions after your visit to [facility], please contact our office at [phone] and follow the prompts.  Our office hours are 8:00 a.m. and 4:30 p.m. Monday - Friday.  Please note that voicemails left after 4:00 p.m. may not be returned until the following business day.  We are closed weekends and major holidays.  You do have access to a nurse 24-7, just call the main number to the clinic [phone] and do not press any options, hold on the line and a nurse will answer the phone.    For prescription refill requests, have your pharmacy contact our office and allow 72 hours.    Due to Covid, you will need to wear a mask upon entering the hospital. If you do not have a mask, a mask will be given to you at the Main Entrance upon arrival. For doctor visits, patients may have 1 support person age 72 or older with them. For treatment visits, patients can not have anyone with them due to social distancing guidelines and our immunocompromised population.

## 2021-03-08 ENCOUNTER — Telehealth: Payer: Self-pay | Admitting: Internal Medicine

## 2021-03-08 DIAGNOSIS — R600 Localized edema: Secondary | ICD-10-CM

## 2021-03-08 DIAGNOSIS — R7989 Other specified abnormal findings of blood chemistry: Secondary | ICD-10-CM

## 2021-03-08 NOTE — Telephone Encounter (Signed)
    Pt's daughter calling back to get pt's echo result

## 2021-03-08 NOTE — Telephone Encounter (Signed)
Spoke with pt and obtained verbal permission to speak with daughter about results.  Pt states this would be fine.  Spoke with daughter and reviewed labs and echo.  States swelling has improved.  Advised if she notices increased swelling or weight gain on decreased dose, please contact the office.  Daughter verbalized understanding and was appreciative for call.

## 2021-03-15 DIAGNOSIS — I10 Essential (primary) hypertension: Secondary | ICD-10-CM | POA: Diagnosis not present

## 2021-03-15 DIAGNOSIS — E119 Type 2 diabetes mellitus without complications: Secondary | ICD-10-CM | POA: Diagnosis not present

## 2021-03-19 ENCOUNTER — Other Ambulatory Visit: Payer: Self-pay | Admitting: Cardiology

## 2021-03-19 DIAGNOSIS — R600 Localized edema: Secondary | ICD-10-CM | POA: Diagnosis not present

## 2021-03-19 DIAGNOSIS — E1122 Type 2 diabetes mellitus with diabetic chronic kidney disease: Secondary | ICD-10-CM | POA: Diagnosis not present

## 2021-03-19 DIAGNOSIS — N189 Chronic kidney disease, unspecified: Secondary | ICD-10-CM | POA: Diagnosis not present

## 2021-03-19 DIAGNOSIS — R7989 Other specified abnormal findings of blood chemistry: Secondary | ICD-10-CM | POA: Diagnosis not present

## 2021-03-19 DIAGNOSIS — E211 Secondary hyperparathyroidism, not elsewhere classified: Secondary | ICD-10-CM | POA: Diagnosis not present

## 2021-03-19 DIAGNOSIS — Z79899 Other long term (current) drug therapy: Secondary | ICD-10-CM

## 2021-03-19 DIAGNOSIS — I129 Hypertensive chronic kidney disease with stage 1 through stage 4 chronic kidney disease, or unspecified chronic kidney disease: Secondary | ICD-10-CM | POA: Diagnosis not present

## 2021-03-19 DIAGNOSIS — I5032 Chronic diastolic (congestive) heart failure: Secondary | ICD-10-CM | POA: Diagnosis not present

## 2021-03-21 MED ORDER — POTASSIUM CHLORIDE ER 10 MEQ PO TBCR: 20.0000 meq | EXTENDED_RELEASE_TABLET | ORAL | 3 refills | Status: DC

## 2021-03-21 NOTE — Addendum Note (Signed)
Addended by: Levonne Hubert on: 03/21/2021 09:03 AM   Modules accepted: Orders

## 2021-03-22 ENCOUNTER — Other Ambulatory Visit (HOSPITAL_COMMUNITY): Payer: Self-pay | Admitting: Nephrology

## 2021-03-22 DIAGNOSIS — D638 Anemia in other chronic diseases classified elsewhere: Secondary | ICD-10-CM | POA: Diagnosis not present

## 2021-03-22 DIAGNOSIS — E1122 Type 2 diabetes mellitus with diabetic chronic kidney disease: Secondary | ICD-10-CM | POA: Diagnosis not present

## 2021-03-22 DIAGNOSIS — E211 Secondary hyperparathyroidism, not elsewhere classified: Secondary | ICD-10-CM | POA: Diagnosis not present

## 2021-03-22 DIAGNOSIS — I129 Hypertensive chronic kidney disease with stage 1 through stage 4 chronic kidney disease, or unspecified chronic kidney disease: Secondary | ICD-10-CM | POA: Diagnosis not present

## 2021-03-22 DIAGNOSIS — N170 Acute kidney failure with tubular necrosis: Secondary | ICD-10-CM

## 2021-03-22 DIAGNOSIS — N189 Chronic kidney disease, unspecified: Secondary | ICD-10-CM | POA: Diagnosis not present

## 2021-03-23 LAB — COMPLETE METABOLIC PANEL WITH GFR
AG Ratio: 0.9 (calc) — ABNORMAL LOW (ref 1.0–2.5)
ALT: 14 U/L (ref 6–29)
AST: 18 U/L (ref 10–35)
Albumin: 2.7 g/dL — ABNORMAL LOW (ref 3.6–5.1)
Alkaline phosphatase (APISO): 58 U/L (ref 37–153)
BUN/Creatinine Ratio: 15 (calc) (ref 6–22)
BUN: 42 mg/dL — ABNORMAL HIGH (ref 7–25)
CO2: 26 mmol/L (ref 20–32)
Calcium: 8.6 mg/dL (ref 8.6–10.4)
Chloride: 100 mmol/L (ref 98–110)
Creat: 2.89 mg/dL — ABNORMAL HIGH (ref 0.60–0.88)
GFR, Est African American: 16 mL/min/{1.73_m2} — ABNORMAL LOW (ref >=60)
GFR, Est Non African American: 14 mL/min/{1.73_m2} — ABNORMAL LOW (ref >=60)
Globulin: 3.1 g/dL (calc) (ref 1.9–3.7)
Glucose, Bld: 172 mg/dL — ABNORMAL HIGH (ref 65–99)
Potassium: 3.9 mmol/L (ref 3.5–5.3)
Sodium: 140 mmol/L (ref 135–146)
Total Bilirubin: 0.5 mg/dL (ref 0.2–1.2)
Total Protein: 5.8 g/dL — ABNORMAL LOW (ref 6.1–8.1)

## 2021-03-23 LAB — NT-PROBNP: Pro B Natriuretic peptide (BNP): 363 pg/mL

## 2021-04-01 ENCOUNTER — Ambulatory Visit (HOSPITAL_COMMUNITY)
Admission: RE | Admit: 2021-04-01 | Discharge: 2021-04-01 | Disposition: A | Discharge status: Home / Self Care | Payer: Medicare Other | Source: Ambulatory Visit | Attending: Nephrology | Admitting: Nephrology

## 2021-04-01 ENCOUNTER — Other Ambulatory Visit (HOSPITAL_COMMUNITY)
Admission: RE | Admit: 2021-04-01 | Discharge: 2021-04-01 | Disposition: A | Discharge status: Home / Self Care | Payer: Medicare Other | Source: Ambulatory Visit | Attending: Cardiology | Admitting: Cardiology

## 2021-04-01 ENCOUNTER — Other Ambulatory Visit: Payer: Self-pay

## 2021-04-01 DIAGNOSIS — N281 Cyst of kidney, acquired: Secondary | ICD-10-CM | POA: Insufficient documentation

## 2021-04-01 DIAGNOSIS — Z79899 Other long term (current) drug therapy: Secondary | ICD-10-CM

## 2021-04-01 DIAGNOSIS — N179 Acute kidney failure, unspecified: Secondary | ICD-10-CM | POA: Diagnosis not present

## 2021-04-01 DIAGNOSIS — E1122 Type 2 diabetes mellitus with diabetic chronic kidney disease: Secondary | ICD-10-CM

## 2021-04-01 DIAGNOSIS — N170 Acute kidney failure with tubular necrosis: Secondary | ICD-10-CM | POA: Diagnosis not present

## 2021-04-01 LAB — BASIC METABOLIC PANEL
Anion gap: 13 (ref 5–15)
BUN: 44 mg/dL — ABNORMAL HIGH (ref 8–23)
CO2: 24 mmol/L (ref 22–32)
Calcium: 7.9 mg/dL — ABNORMAL LOW (ref 8.9–10.3)
Chloride: 98 mmol/L (ref 98–111)
Creatinine, Ser: 3.45 mg/dL — ABNORMAL HIGH (ref 0.44–1.00)
GFR, Estimated: 12 mL/min — ABNORMAL LOW (ref >60)
Glucose, Bld: 173 mg/dL — ABNORMAL HIGH (ref 70–99)
Potassium: 3.8 mmol/L (ref 3.5–5.1)
Sodium: 135 mmol/L (ref 135–145)

## 2021-04-01 LAB — BRAIN NATRIURETIC PEPTIDE: B Natriuretic Peptide: 76 pg/mL (ref 0.0–100.0)

## 2021-04-02 ENCOUNTER — Telehealth: Payer: Self-pay | Admitting: *Deleted

## 2021-04-02 DIAGNOSIS — N183 Chronic kidney disease, stage 3 unspecified: Secondary | ICD-10-CM

## 2021-04-02 NOTE — Telephone Encounter (Signed)
Pt notified and order placed 

## 2021-04-02 NOTE — Telephone Encounter (Signed)
-----   Message from Fay Records, MD sent at 04/01/2021  4:37 PM EDT ----- Kidney function is a little worse   Confirm Lasix   I would recomm stopping if she is on  Stop KCL  Please forward to Dr Legrand Rams   Does she have a renal doctor  Make sure she is drking fluids      Follow BP Recheck next Tues/Wed

## 2021-04-04 ENCOUNTER — Other Ambulatory Visit (HOSPITAL_COMMUNITY)
Admission: RE | Admit: 2021-04-04 | Discharge: 2021-04-04 | Disposition: A | Discharge status: Home / Self Care | Payer: Medicare Other | Source: Ambulatory Visit | Attending: Internal Medicine | Admitting: Internal Medicine

## 2021-04-04 ENCOUNTER — Other Ambulatory Visit: Payer: Self-pay

## 2021-04-04 DIAGNOSIS — N183 Chronic kidney disease, stage 3 unspecified: Secondary | ICD-10-CM | POA: Diagnosis not present

## 2021-04-04 LAB — BASIC METABOLIC PANEL
Anion gap: 9 (ref 5–15)
BUN: 45 mg/dL — ABNORMAL HIGH (ref 8–23)
CO2: 27 mmol/L (ref 22–32)
Calcium: 7.8 mg/dL — ABNORMAL LOW (ref 8.9–10.3)
Chloride: 99 mmol/L (ref 98–111)
Creatinine, Ser: 3.29 mg/dL — ABNORMAL HIGH (ref 0.44–1.00)
GFR, Estimated: 13 mL/min — ABNORMAL LOW (ref >60)
Glucose, Bld: 153 mg/dL — ABNORMAL HIGH (ref 70–99)
Potassium: 3.2 mmol/L — ABNORMAL LOW (ref 3.5–5.1)
Sodium: 135 mmol/L (ref 135–145)

## 2021-04-08 ENCOUNTER — Telehealth: Payer: Self-pay | Admitting: Internal Medicine

## 2021-04-08 MED ORDER — POTASSIUM CHLORIDE CRYS ER 20 MEQ PO TBCR: 20.0000 meq | EXTENDED_RELEASE_TABLET | ORAL | 3 refills | Status: AC

## 2021-04-08 NOTE — Telephone Encounter (Signed)
Agree with recommendation

## 2021-04-08 NOTE — Telephone Encounter (Signed)
New Message:    Patient's granddaughter needs to know how much Potassium is patient supposed to be taking?

## 2021-04-08 NOTE — Telephone Encounter (Signed)
Pt's granddaughter would like to know dose of potassium and frequency.  I adv to restart at previous dose- according to Epic 20 meq every other day.  Adv to give now as she has not restarted.  Labs were from 7/15.  K+ was 3.2.  Granddaughter is unsure when renal appointment is. I asked her to call and make sure she has follow up in renal.

## 2021-04-08 NOTE — Telephone Encounter (Signed)
Pt's granddaughter following up, please advise.

## 2021-04-14 DIAGNOSIS — N184 Chronic kidney disease, stage 4 (severe): Secondary | ICD-10-CM | POA: Diagnosis not present

## 2021-04-14 DIAGNOSIS — E119 Type 2 diabetes mellitus without complications: Secondary | ICD-10-CM | POA: Diagnosis not present

## 2021-04-14 DIAGNOSIS — I10 Essential (primary) hypertension: Secondary | ICD-10-CM | POA: Diagnosis not present

## 2021-05-14 ENCOUNTER — Encounter (HOSPITAL_COMMUNITY): Payer: Self-pay | Admitting: Emergency Medicine

## 2021-05-14 ENCOUNTER — Emergency Department (HOSPITAL_COMMUNITY)
Admission: EM | Admit: 2021-05-14 | Discharge: 2021-05-23 | Disposition: E | Payer: Medicare Other | Attending: Emergency Medicine | Admitting: Emergency Medicine

## 2021-05-14 DIAGNOSIS — N183 Chronic kidney disease, stage 3 unspecified: Secondary | ICD-10-CM | POA: Diagnosis not present

## 2021-05-14 DIAGNOSIS — Z79899 Other long term (current) drug therapy: Secondary | ICD-10-CM | POA: Insufficient documentation

## 2021-05-14 DIAGNOSIS — I499 Cardiac arrhythmia, unspecified: Secondary | ICD-10-CM | POA: Diagnosis not present

## 2021-05-14 DIAGNOSIS — I469 Cardiac arrest, cause unspecified: Secondary | ICD-10-CM | POA: Insufficient documentation

## 2021-05-14 DIAGNOSIS — I491 Atrial premature depolarization: Secondary | ICD-10-CM | POA: Diagnosis not present

## 2021-05-14 DIAGNOSIS — R404 Transient alteration of awareness: Secondary | ICD-10-CM | POA: Diagnosis not present

## 2021-05-14 DIAGNOSIS — E1122 Type 2 diabetes mellitus with diabetic chronic kidney disease: Secondary | ICD-10-CM | POA: Insufficient documentation

## 2021-05-14 DIAGNOSIS — R4020 Unspecified coma: Secondary | ICD-10-CM | POA: Diagnosis not present

## 2021-05-14 DIAGNOSIS — I129 Hypertensive chronic kidney disease with stage 1 through stage 4 chronic kidney disease, or unspecified chronic kidney disease: Secondary | ICD-10-CM | POA: Insufficient documentation

## 2021-05-14 DIAGNOSIS — Z743 Need for continuous supervision: Secondary | ICD-10-CM | POA: Diagnosis not present

## 2021-05-14 LAB — CBG MONITORING, ED: Glucose-Capillary: 216 mg/dL — ABNORMAL HIGH (ref 70–99)

## 2021-05-14 MED ORDER — EPINEPHRINE 1 MG/10ML IJ SOSY
PREFILLED_SYRINGE | INTRAMUSCULAR | Status: AC | PRN
  Administered 2021-05-14: 0.1 mg via INTRAVENOUS

## 2021-05-14 MED ORDER — CALCIUM CHLORIDE 10 % IV SOLN
INTRAVENOUS | Status: AC | PRN
Start: 2021-05-14 — End: 2021-05-14
  Administered 2021-05-14: 1 g via INTRAVENOUS

## 2021-05-14 MED ORDER — AMIODARONE HCL 150 MG/3ML IV SOLN
INTRAVENOUS | Status: AC | PRN
  Administered 2021-05-14: 150 mg via INTRAVENOUS

## 2021-05-14 MED ORDER — SODIUM BICARBONATE 8.4 % IV SOLN
INTRAVENOUS | Status: AC | PRN
  Administered 2021-05-14: 50 meq via INTRAVENOUS

## 2021-05-14 MED ORDER — MAGNESIUM SULFATE 50 % IJ SOLN
INTRAMUSCULAR | Status: AC | PRN
Start: 2021-05-14 — End: 2021-05-14
  Administered 2021-05-14: 2 g via INTRAVENOUS

## 2021-05-14 MED ORDER — EPINEPHRINE 1 MG/10ML IJ SOSY
PREFILLED_SYRINGE | INTRAMUSCULAR | Status: AC | PRN
  Administered 2021-05-14 (×2): 0.1 mg via INTRAVENOUS

## 2021-05-16 MED FILL — Medication: Qty: 1 | Status: AC

## 2021-05-23 NOTE — Code Documentation (Addendum)
Pulse check, v fib, shock delivered 200J, no change, CPR continued

## 2021-05-23 NOTE — Code Documentation (Addendum)
Pulse check. V fib. Shock delivered 200J, no change, CPR continued

## 2021-05-23 NOTE — Code Documentation (Signed)
Patient time of death occurred at 23

## 2021-05-23 NOTE — Code Documentation (Signed)
7.0 ET 23 at the lip

## 2021-05-23 NOTE — Code Documentation (Signed)
IO to the R fibia by MD Pearline Cables

## 2021-05-23 NOTE — ED Triage Notes (Signed)
Daughter found patient unresponsive and called EMS. Upon EMS arrival, pt agonal breathing. Pt went asystole in route, EMS initiated CPR and gave 2 epi before arrival.

## 2021-05-23 NOTE — Code Documentation (Signed)
Pulse check, v fib, shock delivered 200J, no change, CPR continued

## 2021-05-23 NOTE — Code Documentation (Addendum)
Pulse check, no pulse present

## 2021-05-23 NOTE — Code Documentation (Signed)
Family at bedside. 

## 2021-05-23 NOTE — ED Provider Notes (Addendum)
[facility] EMERGENCY DEPARTMENT Provider Note   CSN: [id number] Arrival date & time: 06-11-21  4627     History Chief Complaint  Patient presents with   CPR    Lynn Mcclure is a 87 y.o. female.  87 yo female patient who presents as unresponsive, cardiac arrest. Per EMS patient was unresponsive upon their arrival, family performing CPR. Family reports patient was "breathing funny" just prior to this and collapsed on the floor. They called EMS immediately. EMS performing CPR upon arrival, Epi given x2 by EMS. No defib given. Bagging with ambubag on arrival. Upon arrival patient transferred to gurney and placed on LUCAS device, RT able to bag the patient easily. Patient placed on monitor. IV access obtained.   Level 5 caveat, unresponsive  The history is provided by the EMS personnel and a relative. The history is limited by the condition of the patient. No language interpreter was used.      Past Medical History:  Diagnosis Date   CKD (chronic kidney disease), stage III ([place])    Diabetes mellitus    Hyperlipidemia    Hypertension    Normocytic anemia    Osteoarthritis    Seborrheic keratosis     Patient Active Problem List   Diagnosis Date Noted   Abnormal SPEP 02/11/2021   ANGINA, STABLE 11/29/2008   ANEMIA, NORMOCYTIC 01/22/2007   DIABETES MELLITUS, TYPE II, CONTROLLED 10/30/2006   HYPERLIPIDEMIA 08/20/2006   HYPERTENSION 08/20/2006   KIDNEY DISEASE, CHRONIC, STAGE III 08/20/2006   SEBORRHEIC KERATOSIS 08/20/2006   OSTEOARTHRITIS 08/20/2006    Past Surgical History:  Procedure Laterality Date   COLONOSCOPY  2014     OB History   No obstetric history on file.     Family History  Problem Relation Age of Onset   Heart attack Mother    Coronary artery disease Brother    Anemia Sister    Diabetes Sister     Social History   Tobacco Use   Smoking status: Never   Smokeless tobacco: Never  Substance Use Topics   Alcohol use: No   Drug use: No     Home Medications Prior to Admission medications   Medication Sig Start Date End Date Taking? Authorizing Provider  acetaminophen (TYLENOL) 500 MG tablet Take 500 mg by mouth every 6 (six) hours as needed for mild pain.    [provider]  atorvastatin (LIPITOR) 20 MG tablet Take 20 mg by mouth daily.    [provider]  glyBURIDE (DIABETA) 5 MG tablet Take 5 mg by mouth daily with breakfast.    [provider]  Iron-Vitamins (S.S.S. TONIC PO) Take 5 mLs by mouth daily.    [provider]  isosorbide mononitrate (ISMO,MONOKET) 10 MG tablet Take 10 mg by mouth 2 (two) times daily.    [provider]  metoprolol tartrate (LOPRESSOR) 25 MG tablet Take 25 mg by mouth 2 (two) times daily.    [provider]  paricalcitol (ZEMPLAR) 1 MCG capsule Take 1 mcg by mouth daily.    [provider]  potassium chloride SA (KLOR-CON) 20 MEQ tablet Take 1 tablet (20 mEq total) by mouth every other day. 04/08/21   Fay Records, MD  vitamin C (ASCORBIC ACID) 500 MG tablet Take 500 mg by mouth daily.    [provider]  Vitamin D, Cholecalciferol, 25 MCG (1000 UT) TABS Take by mouth.    [provider]  zinc gluconate 50 MG tablet Take 50 mg  by mouth daily.    [provider]  fluvastatin XL (LESCOL XL) 80 MG 24 hr tablet Take 80 mg by mouth daily.  12/16/11  [provider]    Allergies    Atenolol, Etodolac, Furosemide, Red dye, Rosiglitazone-glimepiride, and Asa [aspirin]  Review of Systems   Review of Systems  Unable to perform ROS: Acuity of condition   Physical Exam Updated Vital Signs Ht _0  (1.702 m)   Wt 61.1 kg   BMI 21.10 kg/m   Physical Exam Vitals and nursing note reviewed. Exam conducted with a chaperone present.  Constitutional:      General: She is in acute distress.     Appearance: She is ill-appearing.  HENT:     Head: Normocephalic and atraumatic. No raccoon eyes, Battle's  sign, right periorbital erythema or left periorbital erythema.     Right Ear: External ear normal.     Left Ear: External ear normal.     Nose: Nose normal.     Mouth/Throat:     Mouth: Mucous membranes are dry.     Comments: dentures Eyes:     General: No scleral icterus.    Comments: Pupils 87m b/l non-reactive   Cardiovascular:     Comments: Pulseless  Pulmonary:     Effort: Respiratory distress present.     Comments: Mechanical breath sounds b/l. No spontaneous respirations.  Abdominal:     General: Abdomen is flat. There is no distension.     Palpations: Abdomen is soft. There is no mass.  Musculoskeletal:     Cervical back: No rigidity.     Right lower leg: Edema (3+ pitting) present.     Left lower leg: Edema (3+ pitting) present.  Skin:    General: Skin is cool.     Capillary Refill: Capillary refill takes more than 3 seconds.       Neurological:     Mental Status: She is unresponsive.     GCS: GCS eye subscore is 1. GCS verbal subscore is 1. GCS motor subscore is 1.     Motor: No seizure activity.    ED Results / Procedures / Treatments   Labs (all labs ordered are listed, but only abnormal results are displayed) Labs Reviewed  CBG MONITORING, ED - Abnormal; Notable for the following components:      Result Value   Glucose-Capillary 216 (*)    All other components within normal limits    EKG None  Radiology No results found.  Procedures .Critical Care  Date/Time: 808-31-229:35 AM Performed by: GJeanell Sparrow DO Authorized by: GJeanell Sparrow DO   Critical care provider statement:    Critical care time (minutes):  36   Critical care time was exclusive of:  Separately billable procedures and treating other patients   Critical care was necessary to treat or prevent imminent or life-threatening deterioration of the following conditions:  Circulatory failure and respiratory failure   Critical care was time spent personally by me on the following  activities:  Discussions with consultants, evaluation of patient's response to treatment, examination of patient, ordering and performing treatments and interventions, ordering and review of laboratory studies, ordering and review of radiographic studies, pulse oximetry, re-evaluation of patient's condition, obtaining history from patient or surrogate and review of old charts Procedure Name: Intubation Date/Time: 8Aug 31, 20229:35 AM Performed by: GJeanell Sparrow DO Pre-anesthesia Checklist: Patient identified, Timeout performed and Suction available Oxygen Delivery Method: Ambu bag Preoxygenation: Pre-oxygenation with 100% oxygen  Ventilation: Mask ventilation without difficulty Laryngoscope Size: 3 and Mac Grade View: Grade II Tube size: 7.0 mm Number of attempts: 1 Airway Equipment and Method: Video-laryngoscopy and Stylet Placement Confirmation: Breath sounds checked- equal and bilateral Secured at: 23 cm Tube secured with: ETT holder Dental Injury: Teeth and Oropharynx as per pre-operative assessment  Difficulty Due To: Difficulty was unanticipated Comments: No induction medication required as patient unresponsive, cardiac arrest     IO LINE INSERTION  Date/Time: May 30, 2021 9:43 AM Performed by: Jeanell Sparrow, DO Authorized by: Jeanell Sparrow, DO   Consent:    Consent obtained:  Emergent situation   Alternatives discussed:  No treatment Universal protocol:    Patient identity confirmed:  Anonymous protocol, patient vented/unresponsive Pre-procedure details:    Site preparation:  Alcohol Anesthesia:    Anesthesia method:  None Procedure details:    Insertion site:  R proximal tibia   Insertion device:  Drill device   Insertion: Needle was inserted through the bony cortex     Number of attempts:  1   Insertion confirmation:  Aspiration of blood/marrow, easy infusion of fluids and stability of the needle Post-procedure details:    Secured with:  Transparent dressing and  protective shield   Procedure completion:  Tolerated well, no immediate complications   Medications Ordered in ED Medications  EPINEPHrine (ADRENALIN) 1 MG/10ML injection (0.1 mg Intravenous Given 2021-05-30 0854)  sodium bicarbonate injection (50 mEq Intravenous Given May 30, 2021 0855)  calcium chloride injection (1 g Intravenous Given 05/30/21 0856)  amiodarone (CORDARONE) injection (150 mg Intravenous Given 2021/05/30 0859)  EPINEPHrine (ADRENALIN) 1 MG/10ML injection (0.1 mg Intravenous Given May 30, 2021 0903)  magnesium sulfate (IV Push/IM) injection (2 g Intravenous Given 30-May-2021 9147)    ED Course  I have reviewed the triage vital signs and the nursing notes.  Pertinent labs & imaging results that were available during my care of the patient were reviewed by me and considered in my medical decision making (see chart for details).    MDM Rules/Calculators/A&P                          Patient presents in cardiac arrest. Estimated downtime reported by EMS was 15-20 min prior to arrival. Epinephrine was given in the field x2. Pt taken to resus room and ACLS protocol was continued. POC glucose 216. Patient intubated and IO was obtained to right tibia; see procedure note. Emergent consent as patient unresponsive. Refer to ED code summary for full details on medications and procedures. Patient with intermitted v-fig on monitor not responsive to defibrillation or amiodarone.   Family to bedside, agreeable with cessation of resuscitation efforts at this time.  No spontaneous respirations. Pupils fixed and dilated. No response to noxious stimulus. Asystole was confirmed by 3 lead EKG and/or bedside US. Likely cause of death is cardiac arrest.   TOD 0915  PCP, Dr. Dr Rosita Fire (PCP) was contacted at [phone], was notified. This isn't a medical examiner case. PCP will sign the death certificate. Family member, daughter Hoyle Sauer) and granddaughter was at bedside to identify the pt.     Final  Clinical Impression(s) / ED Diagnoses Final diagnoses:  Cardiac arrest [facility])    Rx / DC Orders ED Discharge Orders     None        Jeanell Sparrow, DO 30-May-2021 [place], [place], DO 2021-05-30 2004    Jeanell Sparrow, DO 30-May-2021 2006

## 2021-05-23 NOTE — Code Documentation (Addendum)
Shock delivered 200J, CPR continued

## 2021-05-23 DEATH — deceased

## 2021-06-04 ENCOUNTER — Other Ambulatory Visit (HOSPITAL_COMMUNITY): Payer: Medicare Other

## 2021-06-11 ENCOUNTER — Ambulatory Visit (HOSPITAL_COMMUNITY): Payer: Medicare Other | Admitting: Physician Assistant

## 2021-08-22 IMAGING — DX DG CHEST 1V PORT
1 series · 1 of 1 positions shown · non-contrast
Comparison: None.

CLINICAL DATA: Lower extremities lying

EXAM:
PORTABLE CHEST 1 VIEW

[chest ap]
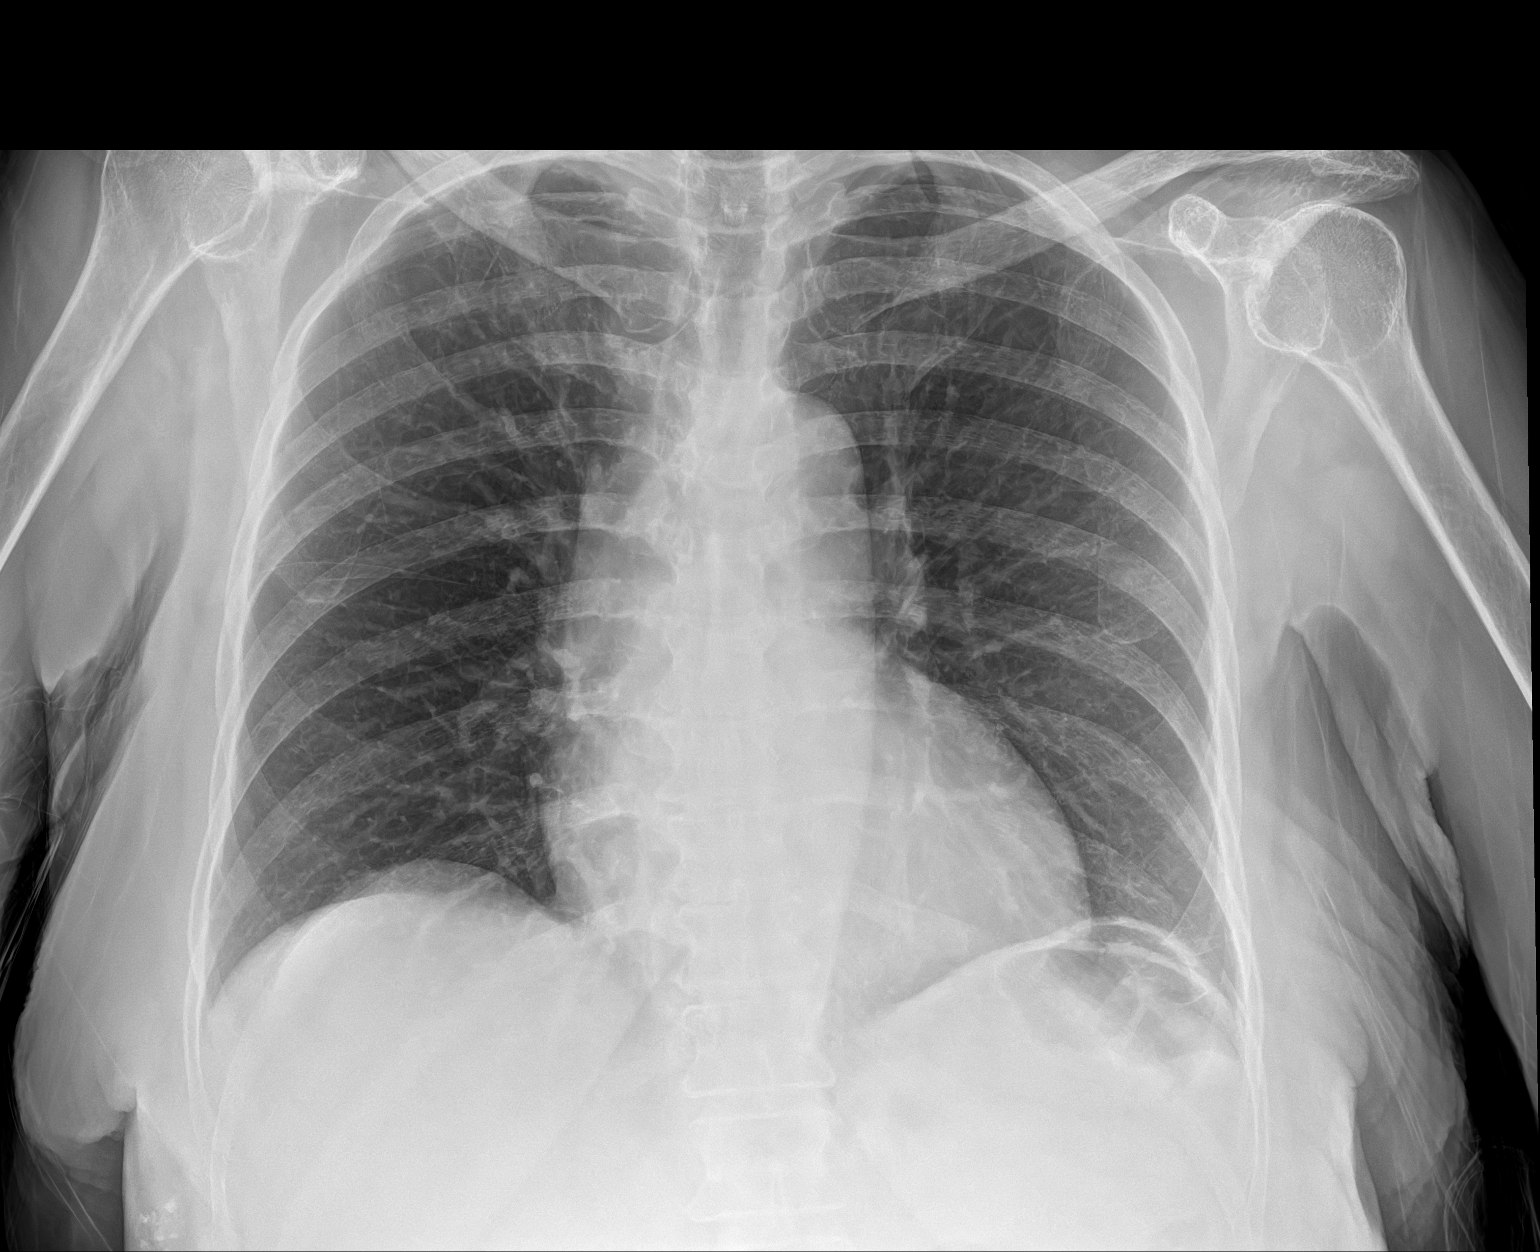

[1 of 1 positions shown; findings below may reference images not displayed]

FINDINGS: Normal heart size and pulmonary vascularity. Lungs are clear. No
pleural effusions. No pneumothorax. Mediastinal contours appear
intact. Degenerative changes in the spine and shoulders.
IMPRESSION: No active disease.
